# Patient Record
Sex: Female | Born: 2000 | Race: White | Hispanic: Yes | Marital: Single | Smoking: Never smoker
Health system: Southern US, Community
[De-identification: ages and names within clinical notes are randomized; demographics above are authoritative.]

## PROBLEM LIST (undated history)

## (undated) DIAGNOSIS — F419 Anxiety disorder, unspecified: Secondary | ICD-10-CM

## (undated) DIAGNOSIS — L700 Acne vulgaris: Secondary | ICD-10-CM

## (undated) HISTORY — DX: Acne vulgaris: L70.0

## (undated) HISTORY — DX: Anxiety disorder, unspecified: F41.9

---
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
## 2020-03-15 ENCOUNTER — Ambulatory Visit: Payer: BC Managed Care – PPO | Admitting: Dermatology

## 2020-03-15 ENCOUNTER — Other Ambulatory Visit: Payer: Self-pay

## 2020-03-15 VITALS — BP 117/81 | HR 74

## 2020-03-15 DIAGNOSIS — R21 Rash and other nonspecific skin eruption: Secondary | ICD-10-CM | POA: Diagnosis not present

## 2020-03-15 DIAGNOSIS — L700 Acne vulgaris: Secondary | ICD-10-CM

## 2020-03-15 DIAGNOSIS — Z79899 Other long term (current) drug therapy: Secondary | ICD-10-CM

## 2020-03-15 MED ORDER — KETOCONAZOLE 2 % EX CREA: TOPICAL_CREAM | CUTANEOUS | 1 refills | Status: DC

## 2020-03-15 MED ORDER — ADAPALENE 0.3 % EX GEL: 1.0000 "application " | Freq: Every day | CUTANEOUS | 3 refills | Status: DC

## 2020-03-15 MED ORDER — DOXYCYCLINE MONOHYDRATE 100 MG PO CAPS: 100.0000 mg | ORAL_CAPSULE | Freq: Every day | ORAL | 2 refills | Status: DC

## 2020-03-15 MED ORDER — SPIRONOLACTONE 100 MG PO TABS: 100.0000 mg | ORAL_TABLET | Freq: Every day | ORAL | 2 refills | Status: DC

## 2020-03-15 NOTE — Progress Notes (Signed)
   Follow-Up Visit   Subjective  Janet Long is a 19 y.o. female who presents for the following: Acne (face, chest back. Improving. Doxycycline mono 100mg  qd, adapalene 0.3% gel qhs, CeraVe Hydrating Cleanser.) and Rash (left lower neck x 3 weeks. Not itchy. Not currently using anything on it. No perfume or lotions used.).  Not scaly.  Has been sweating more.  She states acne worse with period.   The following portions of the chart were reviewed this encounter and updated as appropriate:     Review of Systems: No other skin or systemic complaints.  Objective  Well appearing patient in no apparent distress; mood and affect are within normal limits.  A focused examination was performed including face, chest, back, neck. Relevant physical exam findings are noted in the Assessment and Plan.  Objective  Face, chest, back: Violaceous macules on cheeks, cystic papules on right and left cheek, resolving inflammatory papules on back and shoulders, violaceous macules on back.  Objective  bilateral lower neck: Light pink brown patches, no scale.  Assessment & Plan  Acne vulgaris Face, chest, back  Start Spironolactone 100mg  take 1 po qd. Spironolactone can cause increased urination and cause blood pressure to decrease. Please watch for signs of lightheadedness and be cautious when changing position. It can sometimes cause breast tenderness or an irregular period in premenopausal women. It can also increase potassium. The increase in potassium usually is not a concern unless you are taking other medicines that also increase potassium, so please be sure your doctor knows all of the other medications you are taking. This medication should not be taken  by pregnant women.   Continue doxycycline monohydrate 100mg  take 1 po qd. Doxycycline should be taken with food to prevent nausea. Do not lay down for 30 minutes after taking. Be cautious with sun exposure and use good sun protection while on  this medication. Pregnant women should not take this medication.    Continue adapalene 0.3% gel qhs. Topical retinoid medications like tretinoin/Retin-A, adapalene/Differin, tazarotene/Fabior, and Epiduo/Epiduo Forte can cause dryness and irritation when first started. Only apply a pea-sized amount to the entire affected area. Avoid applying it around the eyes, edges of mouth and creases at the nose. If you experience irritation, use a good moisturizer first and/or apply the medicine less often. If you are doing well with the medicine, you can increase how often you use it until you are applying every night. Be careful with sun protection while using this medication as it can make you sensitive to the sun. This medicine should not be used by pregnant women.    Continue CeraVe Hydrating Cleanser.  On follow-up visit, anticipate d/c doxycycline if controlled.  spironolactone (ALDACTONE) 100 MG tablet - Face, chest, back  Adapalene (DIFFERIN) 0.3 % gel - Face, chest, back  Rash bilateral lower neck  Intertrigo  Start ketoconazole 2% cream Apply to Aas rash on neck qd/bid until improved. Hydrocortisone 1% Cream prn itch.  ketoconazole (NIZORAL) 2 % cream - bilateral lower neck  Return in about 10 weeks (around 05/24/2020) for acne.   I , CMA, am acting as scribe for , MD .

## 2020-05-31 ENCOUNTER — Ambulatory Visit: Payer: BC Managed Care – PPO | Admitting: Dermatology

## 2020-05-31 ENCOUNTER — Other Ambulatory Visit: Payer: Self-pay

## 2020-05-31 VITALS — BP 121/72 | HR 70

## 2020-05-31 DIAGNOSIS — L700 Acne vulgaris: Secondary | ICD-10-CM

## 2020-05-31 MED ORDER — SPIRONOLACTONE 100 MG PO TABS
100.0000 mg | ORAL_TABLET | Freq: Every day | ORAL | 2 refills | Status: DC
Start: 2020-05-31 — End: 2020-11-23

## 2020-05-31 NOTE — Patient Instructions (Signed)
Spironolactone can cause increased urination and cause blood pressure to decrease. Please watch for signs of lightheadedness and be cautious when changing position. It can sometimes cause breast tenderness or an irregular period in premenopausal women. It can also increase potassium. The increase in potassium usually is not a concern unless you are taking other medicines that also increase potassium, so please be sure your doctor knows all of the other medications you are taking. This medication should not be taken  by pregnant women.  

## 2020-05-31 NOTE — Progress Notes (Signed)
   Follow-Up Visit   Subjective  Janet Long is a 19 y.o. female who presents for the following: Acne (improved - using Spironolactone 100mg  1/2 tab po QD, doxycycline 100mg  QD, adapalene 0.3% gel QHS).  Not getting cystic bumps like before.  She is very happy with how her skin is now with treatment.  No side effects noted from spironolactone.  Takes 50 mg daily.   The following portions of the chart were reviewed this encounter and updated as appropriate:      Review of Systems:  No other skin or systemic complaints except as noted in HPI or Assessment and Plan.  Objective  Well appearing patient in no apparent distress; mood and affect are within normal limits.  A focused examination was performed including face. Relevant physical exam findings are noted in the Assessment and Plan.  Objective  Face: Violaceous macules on cheeks; few closed comedones on chin, forehead.  BP 121/72   Assessment & Plan  Acne vulgaris Face  Improved. Finish doxycycline then d/c Increase Spironolactone 100mg  1 po QHS. If having side effects, will decrease back to 50mg . Continue adapalene 0.3% gel QHS.  Spironolactone can cause increased urination and cause blood pressure to decrease. Please watch for signs of lightheadedness and be cautious when changing position. It can sometimes cause breast tenderness or an irregular period in premenopausal women. It can also increase potassium. The increase in potassium usually is not a concern unless you are taking other medicines that also increase potassium, so please be sure your doctor knows all of the other medications you are taking. This medication should not be taken  by pregnant women.  Topical retinoid medications like tretinoin/Retin-A, adapalene/Differin, tazarotene/Fabior, and Epiduo/Epiduo Forte can cause dryness and irritation when first started. Only apply a pea-sized amount to the entire affected area. Avoid applying it around the eyes, edges  of mouth and creases at the nose. If you experience irritation, use a good moisturizer first and/or apply the medicine less often. If you are doing well with the medicine, you can increase how often you use it until you are applying every night. Be careful with sun protection while using this medication as it can make you sensitive to the sun. This medicine should not be used by pregnant women.    Reordered Medications spironolactone (ALDACTONE) 100 MG tablet  Other Related Medications Adapalene (DIFFERIN) 0.3 % gel  Return in about 3 months (around 08/31/2020) for acne.   I , CMA, am acting as scribe for , MD .  Documentation: I have reviewed the above documentation for accuracy and completeness, and I agree with the above.  MD

## 2020-07-15 ENCOUNTER — Telehealth: Payer: Self-pay

## 2020-07-15 NOTE — Telephone Encounter (Signed)
Patient called regarding breakouts since stopping Doxycycline. She states she has very few Ryzen Deady heads or black heads pop up on her face or back weekly. Should she start back on any medication or just continue Spironolactone?

## 2020-07-19 NOTE — Telephone Encounter (Signed)
She should have increased her Spironolactone to 100 mg daily.  Have her continue that for now.  We can move her follow-up appointment to a month earlier and I can see how the break-outs are doing off the doxycycline.  We may also want to consider other options, like accutane, if the spironolactone isn't helping by itself.  Also continue the Rx adapalene gel qd.

## 2020-07-20 NOTE — Telephone Encounter (Signed)
Left message for the patient to return my call.

## 2020-07-29 NOTE — Telephone Encounter (Signed)
Patient called this afternoon to let me know her acne has clears and she will keep her October appointment.

## 2020-09-06 ENCOUNTER — Other Ambulatory Visit: Payer: Self-pay

## 2020-09-06 ENCOUNTER — Ambulatory Visit (INDEPENDENT_AMBULATORY_CARE_PROVIDER_SITE_OTHER): Payer: BC Managed Care – PPO | Admitting: Dermatology

## 2020-09-06 VITALS — BP 117/71

## 2020-09-06 DIAGNOSIS — L700 Acne vulgaris: Secondary | ICD-10-CM | POA: Diagnosis not present

## 2020-09-06 MED ORDER — DROSPIRENONE-ETHINYL ESTRADIOL 3-0.02 MG PO TABS: 1.0000 | ORAL_TABLET | Freq: Every day | ORAL | 5 refills | Status: DC

## 2020-09-06 NOTE — Patient Instructions (Signed)
Spironolactone can cause increased urination and cause blood pressure to decrease. Please watch for signs of lightheadedness and be cautious when changing position. It can sometimes cause breast tenderness or an irregular period in premenopausal women. It can also increase potassium. The increase in potassium usually is not a concern unless you are taking other medicines that also increase potassium, so please be sure your doctor knows all of the other medications you are taking. This medication should not be taken  by pregnant women.  Topical retinoid medications like tretinoin/Retin-A, adapalene/Differin, tazarotene/Fabior, and Epiduo/Epiduo Forte can cause dryness and irritation when first started. Only apply a pea-sized amount to the entire affected area. Avoid applying it around the eyes, edges of mouth and creases at the nose. If you experience irritation, use a good moisturizer first and/or apply the medicine less often. If you are doing well with the medicine, you can increase how often you use it until you are applying every night. Be careful with sun protection while using this medication as it can make you sensitive to the sun. This medicine should not be used by pregnant women.   

## 2020-09-06 NOTE — Progress Notes (Signed)
   Follow-Up Visit   Subjective  Janet Long is a 19 y.o. female who presents for the following: Acne (face, chest, back. Much improved. She is taking Spironolactone 100mg  daily and using Adapalene 0.3% Gel. She is not having many breakouts. She has been off of doxycycline for 3 months.). She has had irregular menstrual cycles since starting Spironolactone. She currently is on her period.  No other side effects.  No dizziness, light-headedness. No family history of breast cancer or clotting disorders.   The following portions of the chart were reviewed this encounter and updated as appropriate:      Review of Systems:  No other skin or systemic complaints except as noted in HPI or Assessment and Plan.  Objective  Well appearing patient in no apparent distress; mood and affect are within normal limits.  A focused examination was performed including face, chest. Relevant physical exam findings are noted in the Assessment and Plan.  Objective  Face: Few comedones perioral, otherwise face clear. Old  acne scarring on BL cheeks BP 117/71    Assessment & Plan  Acne vulgaris Face  Improved on spironolactone, but irregular periods  Continue Adapalene 0.3% Gel qhs face. Continue Spironolactone 100mg  1 po QD. Start Yaz take as directed 5Rf.  Discussed coming off Spironolactone and continuing with Yaz. May consider on follow-up.   Topical retinoid medications like tretinoin/Retin-A, adapalene/Differin, tazarotene/Fabior, and Epiduo/Epiduo Forte can cause dryness and irritation when first started. Only apply a pea-sized amount to the entire affected area. Avoid applying it around the eyes, edges of mouth and creases at the nose. If you experience irritation, use a good moisturizer first and/or apply the medicine less often. If you are doing well with the medicine, you can increase how often you use it until you are applying every night. Be careful with sun protection while using this  medication as it can make you sensitive to the sun. This medicine should not be used by pregnant women.   Spironolactone can cause increased urination and cause blood pressure to decrease. Please watch for signs of lightheadedness and be cautious when changing position. It can sometimes cause breast tenderness or an irregular period in premenopausal women. It can also increase potassium. The increase in potassium usually is not a concern unless you are taking other medicines that also increase potassium, so please be sure your doctor knows all of the other medications you are taking. This medication should not be taken  by pregnant women.   drospirenone-ethinyl estradiol (YAZ) 3-0.02 MG tablet - Face  Other Related Medications Adapalene (DIFFERIN) 0.3 % gel spironolactone (ALDACTONE) 100 MG tablet  Return in about 3 months (around 12/07/2020) for acne.  [id number], CMA, am acting as scribe for 02/04/2021, MD .  Documentation: I have reviewed the above documentation for accuracy and completeness, and I agree with the above.  Cherlyn Labella MD

## 2020-09-29 ENCOUNTER — Encounter: Payer: Self-pay | Admitting: Family Medicine

## 2020-09-29 ENCOUNTER — Ambulatory Visit: Payer: BC Managed Care – PPO | Admitting: Family Medicine

## 2020-09-29 ENCOUNTER — Ambulatory Visit: Payer: Self-pay | Admitting: Family Medicine

## 2020-09-29 ENCOUNTER — Other Ambulatory Visit: Payer: Self-pay

## 2020-09-29 VITALS — BP 120/79 | HR 70 | Temp 98.1°F | Ht 62.0 in | Wt 140.0 lb

## 2020-09-29 DIAGNOSIS — M25512 Pain in left shoulder: Secondary | ICD-10-CM | POA: Diagnosis not present

## 2020-09-29 DIAGNOSIS — R197 Diarrhea, unspecified: Secondary | ICD-10-CM

## 2020-09-29 DIAGNOSIS — Z23 Encounter for immunization: Secondary | ICD-10-CM | POA: Diagnosis not present

## 2020-09-29 DIAGNOSIS — L700 Acne vulgaris: Secondary | ICD-10-CM | POA: Diagnosis not present

## 2020-09-29 DIAGNOSIS — R109 Unspecified abdominal pain: Secondary | ICD-10-CM | POA: Diagnosis not present

## 2020-09-29 DIAGNOSIS — F411 Generalized anxiety disorder: Secondary | ICD-10-CM

## 2020-09-29 MED ORDER — HYDROXYZINE HCL 25 MG PO TABS: 12.5000 mg | ORAL_TABLET | Freq: Three times a day (TID) | ORAL | 1 refills | Status: AC | PRN

## 2020-09-29 NOTE — Progress Notes (Addendum)
10/27/202111:45 AM  Janet Long 2001/10/16, 19 y.o., female [id number]  Chief Complaint  Patient presents with  . low abd pain x 3 days    diarrhea in am , abd pain intermittent  during the day, L shoulder pain      HPI:   Patient is a 19 y.o. female with past medical history significant for acne who presents today for diarrhea, abdominal pain and left shoulder pain.   Abdominal pain x3 days, prior to this was every 6 months Intermittent Sharp pain, lasts a few seconds Doesn't happen during the night, doesn't affect sleep Not associated with food Seems to be getting more frequent Started on yaz a few weeks ago LMP: October 1st Normally 21 day cycle Anxiety Common to have Morning with abdominal pain and diarrhea, this has been occurring for about 4 years No changes in diet Denies bloating, or pain on palpation Lower abdominal pain Radiates at times to Shoulder pain and to right lower ribs. Denies spotting Denies urinary symptoms,   Anxiety She has had long term issues with anxiety She has never been treated for this She is still able to accomplish daily activities Throughout the day gets worse with situations    Depression screen [facility] 2/9 09/29/2020 05/03/2017  Decreased Interest 0 0  Down, Depressed, Hopeless 0 0  PHQ - 2 Score 0 0  Altered sleeping 1 0  Tired, decreased energy 0 0  Change in appetite 0 1  Feeling bad or failure about yourself  1 1  Trouble concentrating 1 0  Moving slowly or fidgety/restless 0 0  Suicidal thoughts 0 0  PHQ-9 Score 3 2  Difficult doing work/chores Somewhat difficult -    Fall Risk  09/29/2020  Falls in the past year? 0  Number falls in past yr: 0  Injury with Fall? 0  Follow up Falls evaluation completed     No Known Allergies  Prior to Admission medications   Medication Sig Start Date End Date Taking? Authorizing Provider  Adapalene (DIFFERIN) 0.3 % gel Apply 1 application topically at bedtime. 03/15/20    Willeen Niece, MD  drospirenone-ethinyl estradiol (YAZ) 3-0.02 MG tablet Take 1 tablet by mouth daily. 09/06/20   Willeen Niece, MD  spironolactone (ALDACTONE) 100 MG tablet Take 1 tablet (100 mg total) by mouth daily. 05/31/20   Willeen Niece, MD    Past Medical History:  Diagnosis Date  . Acne vulgaris   . Anxiety     History reviewed. No pertinent surgical history.  Social History   Tobacco Use  . Smoking status: Never Smoker  . Smokeless tobacco: Never Used  Substance Use Topics  . Alcohol use: No    Family History  Problem Relation Age of Onset  . Cancer Paternal Grandmother        breast    Review of Systems  Constitutional: Negative for chills, fever and malaise/fatigue.  Eyes: Negative for blurred vision and double vision.  Respiratory: Negative for cough, shortness of breath and wheezing.   Cardiovascular: Negative for chest pain, palpitations and leg swelling.  Gastrointestinal: Negative for blood in stool, constipation, heartburn, nausea and vomiting.       Daily morning loose stools, intermittent abdominal pain  Genitourinary: Negative for dysuria, flank pain, frequency, hematuria and urgency.  Musculoskeletal: Negative for back pain and joint pain.  Skin: Negative for rash.  Neurological: Negative for dizziness, weakness and headaches.     OBJECTIVE:  Today's Vitals   09/29/20 1103  BP: 120/79  Pulse: 70  Temp: 98.1 F (36.7 C)  SpO2: 98%  Weight: 140 lb (63.5 kg)  Height: 5\' 2"  (1.575 m)   Body mass index is 25.61 kg/m.   Physical Exam Constitutional:      General: She is not in acute distress.    Appearance: Normal appearance. She is not ill-appearing.  HENT:     Head: Normocephalic.  Cardiovascular:     Rate and Rhythm: Normal rate and regular rhythm.     Pulses: Normal pulses.     Heart sounds: Normal heart sounds. No murmur heard.  No friction rub. No gallop.   Pulmonary:     Effort: Pulmonary effort is normal. No respiratory  distress.     Breath sounds: Normal breath sounds. No stridor. No wheezing, rhonchi or rales.  Abdominal:     General: Abdomen is flat. Bowel sounds are normal. There is no distension.     Palpations: Abdomen is soft.     Tenderness: There is no abdominal tenderness. There is no right CVA tenderness or left CVA tenderness.  Musculoskeletal:     Right lower leg: No edema.     Left lower leg: No edema.  Skin:    General: Skin is warm and dry.  Neurological:     Mental Status: She is alert and oriented to person, place, and time.  Psychiatric:        Mood and Affect: Mood normal.        Behavior: Behavior normal.     No results found for this or any previous visit (from the past 24 hour(s)).  No results found.   ASSESSMENT and PLAN  Problem List Items Addressed This Visit      Musculoskeletal and Integument   Acne vulgaris    Other Visit Diagnoses    Diarrhea, unspecified type    -  Primary   Abdominal pain, mid-lower: Most likely related to uterine cramps given recent start of birth control OTC NSAIDS as needed and heating pad for pain   Encouraged to return if symptoms did not improve Didn't want to give a urine sample at this time.   Acute pain of left shoulder       Generalized anxiety disorder       Relevant Medications   hydrOXYzine (ATARAX/VISTARIL) 25 MG tablet 1/2 tab prn for anxiety, discussed this may make her sleepy Contact For therapy Provided 1. [facility] ([address] [place],Heather Hulan [place]) [phone] 2. [facility] - [phone] [place]) [phone] 3. - [phone] Psychological - [phone] 4. [facility] - [phone] (do not file insurance) 5. [facility] - accepts most major insurances. [phone] or email [email or web address]        Return if symptoms worsen or fail to improve, for Annual physical.    [phone] Kynslie Ringle,  FNP-BC Primary [facility] at [facility] [address] [place], [place] Ph.  [phone] Fax [phone]  I have reviewed and agree with above documentation. [phone], MD

## 2020-09-29 NOTE — Patient Instructions (Addendum)
Within 3 months on birth control pill your cycles should regulate Follow up if no improvement in symptoms. Take hydroxyzine as needed for anxiety, may make you sleepy  For therapy -- 1. [facility] ([address] [place], [place] Joyc[place], [place]) [phone] 2. [facility] Raynelle Fanning([place]) [phone] 3. [facility] - [phone] 4. [facility] - [phone] (do not file insurance) 5. [facility] - accepts most major insurances. [phone] or email [email or web address]  Managing Anxiety, Adult After being diagnosed with an anxiety disorder, you may be relieved to know why you have felt or behaved a certain way. You may also feel overwhelmed about the treatment ahead and what it will mean for your life. With care and support, you can manage this condition and recover from it. How to manage lifestyle changes Managing stress and anxiety  Stress is your body's reaction to life changes and events, both good and bad. Most stress will last just a few hours, but stress can be ongoing and can lead to more than just stress. Although stress can play a major role in anxiety, it is not the same as anxiety. Stress is usually caused by something external, such as a deadline, test, or competition. Stress normally passes after the triggering event has ended.  Anxiety is caused by something internal, such as imagining a terrible outcome or worrying that something will go wrong that will devastate you. Anxiety often does not go away even after the triggering event is over, and it can become long-term (chronic) worry. It is important to understand the differences between stress and anxiety and to manage your stress effectively so that it does not lead to an anxious response. Talk with your health care provider or a counselor to learn more about reducing anxiety and stress. He or she may  suggest tension reduction techniques, such as:  Music therapy. This can include creating or listening to music that you enjoy and that inspires you.  Mindfulness-based meditation. This involves being aware of your normal breaths while not trying to control your breathing. It can be done while sitting or walking.  Centering prayer. This involves focusing on a word, phrase, or sacred image that means something to you and brings you peace.  Deep breathing. To do this, expand your stomach and inhale slowly through your nose. Hold your breath for 3-5 seconds. Then exhale slowly, letting your stomach muscles relax.  Self-talk. This involves identifying thought patterns that lead to anxiety reactions and changing those patterns.  Muscle relaxation. This involves tensing muscles and then relaxing them. Choose a tension reduction technique that suits your lifestyle and personality. These techniques take time and practice. Set aside 5-15 minutes a day to do them. Therapists can offer counseling and training in these techniques. The training to help with anxiety may be covered by some insurance plans. Other things you can do to manage stress and anxiety include:  Keeping a stress/anxiety diary. This can help you learn what triggers your reaction and then learn ways to manage your response.  Thinking about how you react to certain situations. You may not be able to control everything, but you can control your response.  Making time for activities that help you relax and not feeling guilty about spending your time in this way.  Visual imagery and yoga can help you stay calm and relax.  Medicines Medicines can help ease symptoms. Medicines for anxiety include:  Anti-anxiety drugs.  Antidepressants.  Medicines are often used as a primary treatment for anxiety disorder. Medicines will be prescribed by a health care provider. When used together, medicines, psychotherapy, and tension reduction techniques  may be the most effective treatment. Relationships Relationships can play a big part in helping you recover. Try to spend more time connecting with trusted friends and family members. Consider going to couples counseling, taking family education classes, or going to family therapy. Therapy can help you and others better understand your condition. How to recognize changes in your anxiety Everyone responds differently to treatment for anxiety. Recovery from anxiety happens when symptoms decrease and stop interfering with your daily activities at home or work. This may mean that you will start to:  Have better concentration and focus. Worry will interfere less in your daily thinking.  Sleep better.  Be less irritable.  Have more energy.  Have improved memory. It is important to recognize when your condition is getting worse. Contact your health care provider if your symptoms interfere with home or work and you feel like your condition is not improving. Follow these instructions at home: Activity  Exercise. Most adults should do the following: ? Exercise for at least 150 minutes each week. The exercise should increase your heart rate and make you sweat (moderate-intensity exercise). ? Strengthening exercises at least twice a week.  Get the right amount and quality of sleep. Most adults need 7-9 hours of sleep each night. Lifestyle   Eat a healthy diet that includes plenty of vegetables, fruits, whole grains, low-fat dairy products, and lean protein. Do not eat a lot of foods that are high in solid fats, added sugars, or salt.  Make choices that simplify your life.  Do not use any products that contain nicotine or tobacco, such as cigarettes, e-cigarettes, and chewing tobacco. If you need help quitting, ask your health care provider.  Avoid caffeine, alcohol, and certain over-the-counter cold medicines. These may make you feel worse. Ask your pharmacist which medicines to avoid. General  instructions  Take over-the-counter and prescription medicines only as told by your health care provider.  Keep all follow-up visits as told by your health care provider. This is important. Where to find support You can get help and support from these sources:  Self-help groups.  Online and [employer].  A trusted spiritual leader.  Couples counseling.  Family education classes.  Family therapy. Where to find more information You may find that joining a support group helps you deal with your anxiety. The following sources can help you locate counselors or support groups near you:  Mental Health America: www.mentalhealthamerica.net  Anxiety and Depression Association of [place] (ADAA): ProgramCam.de  [employer] on Mental Illness (NAMI): www.nami.org Contact a health care provider if you:  Have a hard time staying focused or finishing daily tasks.  Spend many hours a day feeling worried about everyday life.  Become exhausted by worry.  Start to have headaches, feel tense, or have nausea.  Urinate more than normal.  Have diarrhea. Get help right away if you have:  A racing heart and shortness of breath.  Thoughts of hurting yourself or others. If you ever feel like you may hurt yourself or others, or have thoughts about taking your own life, get help right away. You can go to your nearest emergency department or call:  Your local emergency services (911 in the [place]  A suicide crisis helpline, such as the National Suicide Prevention Lifeline at 9183587038. This is open 24 hours a  day. Summary  Taking steps to learn and use tension reduction techniques can help calm you and help prevent triggering an anxiety reaction.  When used together, medicines, psychotherapy, and tension reduction techniques may be the most effective treatment.  Family, friends, and partners can play a big part in helping you recover from an anxiety disorder. This  information is not intended to replace advice given to you by your health care provider. Make sure you discuss any questions you have with your health care provider. Document Revised: 04/22/2019 Document Reviewed: 04/22/2019 Elsevier Patient Education  2020 [employer].   Hormonal Contraception Information Hormonal contraception is a type of birth control that uses hormones to prevent pregnancy. It usually involves a combination of the hormones estrogen and progesterone or only the hormone progesterone. Hormonal contraception works in these ways:  It thickens the mucus in the cervix, making it harder for sperm to enter the uterus.  It changes the lining of the uterus, making it harder for an egg to implant.  It may stop the ovaries from releasing eggs (ovulation). Some women who take hormonal contraceptives that contain only progesterone may continue to ovulate. Hormonal contraception cannot prevent sexually transmitted infections (STIs). Pregnancy may still occur. Estrogen and progesterone contraceptives Contraceptives that use a combination of estrogen and progesterone are available in these forms:  Pill. Pills come in different combinations of hormones. They must be taken at the same time each day. Pills can affect your period, causing you to get your period once every three months or not at all.  Patch. The patch must be worn on the lower abdomen for three weeks and then removed on the fourth.  Vaginal ring. The ring is placed in the vagina and left there for three weeks. It is then removed for one week. Progesterone contraceptives Contraceptives that use progesterone only are available in these forms:  Pill. Pills should be taken every day of the cycle.  Intrauterine device (IUD). This device is inserted into the uterus and removed or replaced every five years or sooner.  Implant. Plastic rods are placed under the skin of the upper arm. They are removed or replaced every three  years or sooner.  Injection. The injection is given once every 90 days. What are the side effects? The side effects of estrogen and progesterone contraceptives include:  Nausea.  Headaches.  Breast tenderness.  Bleeding or spotting between menstrual cycles.  High blood pressure (rare).  Strokes, heart attacks, or blood clots (rare) Side effects of progesterone-only contraceptives include:  Nausea.  Headaches.  Breast tenderness.  Unpredictable menstrual bleeding.  High blood pressure (rare). Talk to your health care provider about what side effects may affect you. Where to find more information  Ask your health care provider for more information and resources about hormonal contraception.  U.S. Department of Health and Cytogeneticist on Women's Health: http://hoffman.com/ Questions to ask:  What type of hormonal contraception is right for me?  How long should I plan to use hormonal contraception?  What are the side effects of the hormonal contraception method I choose?  How can I prevent STIs while using hormonal contraception? Contact a health care provider if:  You start taking hormonal contraceptives and you develop persistent or severe side effects. Summary  Estrogen and progesterone are hormones used in many forms of birth control.  Talk to your health care provider about what side effects may affect you.  Hormonal contraception cannot prevent sexually transmitted infections (STIs).  Ask your  health care provider for more information and resources about hormonal contraception. This information is not intended to replace advice given to you by your health care provider. Make sure you discuss any questions you have with your health care provider. Document Revised: 03/17/2019 Document Reviewed: 10/20/2016 Elsevier Patient Education  [employer].    If you have lab work done today you will be contacted with your lab results within the next  2 weeks.  If you have not heard from [place] then please contact us. The fastest way to get your results is to register for My Chart.   IF you received an x-ray today, you will receive an invoice from [facility]. Please contact [facility] at [phone] with questions or concerns regarding your invoice.   IF you received labwork today, you will receive an invoice from [facility]. Please contact [facility] at [phone] with questions or concerns regarding your invoice.   Our billing staff will not be able to assist you with questions regarding bills from these companies.  You will be contacted with the lab results as soon as they are available. The fastest way to get your results is to activate your My Chart account. Instructions are located on the last page of this paperwork. If you have not heard from [place] regarding the results in 2 weeks, please contact this office.

## 2020-11-22 ENCOUNTER — Other Ambulatory Visit: Payer: Self-pay | Admitting: Dermatology

## 2020-11-22 DIAGNOSIS — L700 Acne vulgaris: Secondary | ICD-10-CM

## 2020-12-06 ENCOUNTER — Ambulatory Visit: Payer: BC Managed Care – PPO | Admitting: Family Medicine

## 2020-12-07 ENCOUNTER — Other Ambulatory Visit: Payer: Self-pay

## 2020-12-07 ENCOUNTER — Ambulatory Visit: Payer: BC Managed Care – PPO | Admitting: Dermatology

## 2020-12-07 DIAGNOSIS — L700 Acne vulgaris: Secondary | ICD-10-CM

## 2020-12-07 NOTE — Progress Notes (Signed)
   Follow-Up Visit   Subjective  Janet Long is a 20 y.o. female who presents for the following: Acne (3 month follow up on acne. Patient still taking Adapalene 0.3 % gel, spironolactone 100 mg tab and Yaz. Patient reports a new breakout on chin and she has concerns with blackheads on nose. ).   The following portions of the chart were reviewed this encounter and updated as appropriate:        Objective  Well appearing patient in no apparent distress; mood and affect are within normal limits.  A focused examination was performed including face . Relevant physical exam findings are noted in the Assessment and Plan.  Objective  Head - Anterior (Face): Resolving cyst on right chin Few closed comedones on chin and nose  Today BP on left wrist 111/73   Assessment & Plan  Acne vulgaris Head - Anterior (Face)  Cystic, controlled Continue Adapalene 0.3% gel, Yaz, and Spironolactone 100 mg  tablet.   Recommend neutrogena BPO spot treatment as needed for new breakouts. Also OTC Sal Acid cleanser for blackheads  Topical retinoid medications like tretinoin/Retin-A, adapalene/Differin, tazarotene/Fabior, and Epiduo/Epiduo Forte can cause dryness and irritation when first started. Only apply a pea-sized amount to the entire affected area. Avoid applying it around the eyes, edges of mouth and creases at the nose. If you experience irritation, use a good moisturizer first and/or apply the medicine less often. If you are doing well with the medicine, you can increase how often you use it until you are applying every night. Be careful with sun protection while using this medication as it can make you sensitive to the sun. This medicine should not be used by pregnant women.   Spironolactone can cause increased urination and cause blood pressure to decrease. Please watch for signs of lightheadedness and be cautious when changing position. It can sometimes cause breast tenderness or an irregular  period in premenopausal women. It can also increase potassium. The increase in potassium usually is not a concern unless you are taking other medicines that also increase potassium, so please be sure your doctor knows all of the other medications you are taking. This medication should not be taken by pregnant women.  This medicine should also not be taken together with sulfa drugs like Bactrim (trimethoprim/sulfamethexazole).    Other Related Medications Adapalene (DIFFERIN) 0.3 % gel drospirenone-ethinyl estradiol (YAZ) 3-0.02 MG tablet spironolactone (ALDACTONE) 100 MG tablet  Return in about 6 months (around 06/06/2021) for acne follow up.   I, Asher Muir, CMA, am acting as scribe for Willeen Niece, MD.  Documentation: I have reviewed the above documentation for accuracy and completeness, and I agree with the above.  Willeen Niece MD

## 2020-12-07 NOTE — Patient Instructions (Addendum)
Recommend Neutrogena salicylic acid wash and spot treatment as needed.   Topical retinoid medications like tretinoin/Retin-A, adapalene/Differin, tazarotene/Fabior, and Epiduo/Epiduo Forte can cause dryness and irritation when first started. Only apply a pea-sized amount to the entire affected area. Avoid applying it around the eyes, edges of mouth and creases at the nose. If you experience irritation, use a good moisturizer first and/or apply the medicine less often. If you are doing well with the medicine, you can increase how often you use it until you are applying every night. Be careful with sun protection while using this medication as it can make you sensitive to the sun. This medicine should not be used by pregnant women.   Spironolactone can cause increased urination and cause blood pressure to decrease. Please watch for signs of lightheadedness and be cautious when changing position. It can sometimes cause breast tenderness or an irregular period in premenopausal women. It can also increase potassium. The increase in potassium usually is not a concern unless you are taking other medicines that also increase potassium, so please be sure your doctor knows all of the other medications you are taking. This medication should not be taken by pregnant women.  This medicine should also not be taken together with sulfa drugs like Bactrim (trimethoprim/sulfamethexazole).

## 2021-02-22 ENCOUNTER — Other Ambulatory Visit: Payer: Self-pay | Admitting: Dermatology

## 2021-02-22 DIAGNOSIS — L700 Acne vulgaris: Secondary | ICD-10-CM

## 2021-03-10 ENCOUNTER — Ambulatory Visit: Payer: BC Managed Care – PPO | Admitting: Family Medicine

## 2021-03-14 ENCOUNTER — Ambulatory Visit: Payer: BC Managed Care – PPO | Admitting: Family Medicine

## 2021-03-15 ENCOUNTER — Other Ambulatory Visit: Payer: Self-pay

## 2021-03-15 DIAGNOSIS — L700 Acne vulgaris: Secondary | ICD-10-CM

## 2021-03-15 MED ORDER — SPIRONOLACTONE 100 MG PO TABS: 100.0000 mg | ORAL_TABLET | Freq: Every day | ORAL | 2 refills | Status: DC

## 2021-03-15 NOTE — Progress Notes (Signed)
Spironolactone RF Request approved

## 2021-06-07 ENCOUNTER — Ambulatory Visit: Payer: BC Managed Care – PPO | Admitting: Dermatology

## 2021-07-20 ENCOUNTER — Other Ambulatory Visit: Payer: Self-pay

## 2021-07-20 ENCOUNTER — Ambulatory Visit: Payer: Self-pay | Admitting: Dermatology

## 2021-07-20 VITALS — BP 117/69

## 2021-07-20 DIAGNOSIS — L700 Acne vulgaris: Secondary | ICD-10-CM

## 2021-07-20 DIAGNOSIS — L810 Postinflammatory hyperpigmentation: Secondary | ICD-10-CM

## 2021-07-20 MED ORDER — PIMECROLIMUS 1 % EX CREA: TOPICAL_CREAM | CUTANEOUS | 2 refills | Status: AC

## 2021-07-20 MED ORDER — DROSPIRENONE-ETHINYL ESTRADIOL 3-0.02 MG PO TABS
1.0000 | ORAL_TABLET | Freq: Every day | ORAL | 5 refills | Status: DC
Start: 2021-07-20 — End: 2022-01-16

## 2021-07-20 MED ORDER — ADAPALENE 0.3 % EX GEL: CUTANEOUS | 3 refills | Status: DC

## 2021-07-20 MED ORDER — SPIRONOLACTONE 100 MG PO TABS: 100.0000 mg | ORAL_TABLET | Freq: Every day | ORAL | 5 refills | Status: AC

## 2021-07-20 NOTE — Patient Instructions (Signed)
Spironolactone can cause increased urination and cause blood pressure to decrease. Please watch for signs of lightheadedness and be cautious when changing position. It can sometimes cause breast tenderness or an irregular period in premenopausal women. It can also increase potassium. The increase in potassium usually is not a concern unless you are taking other medicines that also increase potassium, so please be sure your doctor knows all of the other medications you are taking. This medication should not be taken by pregnant women.  This medicine should also not be taken together with sulfa drugs like Bactrim (trimethoprim/sulfamethexazole).   Topical retinoid medications like tretinoin/Retin-A, adapalene/Differin, tazarotene/Fabior, and Epiduo/Epiduo Forte can cause dryness and irritation when first started. Only apply a pea-sized amount to the entire affected area. Avoid applying it around the eyes, edges of mouth and creases at the nose. If you experience irritation, use a good moisturizer first and/or apply the medicine less often. If you are doing well with the medicine, you can increase how often you use it until you are applying every night. Be careful with sun protection while using this medication as it can make you sensitive to the sun. This medicine should not be used by pregnant women.   If you have any questions or concerns for your doctor, please call our main line at [phone] and press option 4 to reach your doctor's medical assistant. If no one answers, please leave a voicemail as directed and we will return your call as soon as possible. Messages left after 4 pm will be answered the following business day.   You may also send us a message via MyChart. We typically respond to MyChart messages within 1-2 business days.  For prescription refills, please ask your pharmacy to contact our office. Our fax number is [phone].  If you have an urgent issue when the clinic is closed that  cannot wait until the next business day, you can page your doctor at the number below.    Please note that while we do our best to be available for urgent issues outside of office hours, we are not available 24/7.   If you have an urgent issue and are unable to reach us, you may choose to seek medical care at your doctor's office, retail clinic, urgent care center, or emergency room.  If you have a medical emergency, please immediately call 911 or go to the emergency department.  Pager Numbers  - Dr. Kowalski: [phone]  - Dr. Moye: [phone]  - Dr. Stewart: [phone]  In the event of inclement weather, please call our main line at [phone] for an update on the status of any delays or closures.  Dermatology Medication Tips: Please keep the boxes that topical medications come in in order to help keep track of the instructions about where and how to use these. Pharmacies typically print the medication instructions only on the boxes and not directly on the medication tubes.   If your medication is too expensive, please contact our office at [phone] option 4 or send us a message through MyChart.   We are unable to tell what your co-pay for medications will be in advance as this is different depending on your insurance coverage. However, we may be able to find a substitute medication at lower cost or fill out paperwork to get insurance to cover a needed medication.   If a prior authorization is required to get your medication covered by your insurance company, please allow us 1-2 business days to complete this   process.  Drug prices often vary depending on where the prescription is filled and some pharmacies may offer cheaper prices.  The website www.goodrx.com contains coupons for medications through different pharmacies. The prices here do not account for what the cost may be with help from insurance (it may be cheaper with your insurance), but the website can give you the  price if you did not use any insurance.  - You can print the associated coupon and take it with your prescription to the pharmacy.  - You may also stop by our office during regular business hours and pick up a GoodRx coupon card.  - If you need your prescription sent electronically to a different pharmacy, notify our office through [facility] MyChart or by phone at [phone] option 4.  

## 2021-07-20 NOTE — Progress Notes (Signed)
   Follow-Up Visit   Subjective  Janet Long is a 19 y.o. female who presents for the following: Acne (Face, chest, back. Controlled on Spironolactone 100mg  daily and adapalene 0.3% gel qohs. Tried Salicylic acid cleanser, but burned.) and Skin Discoloration (Bilateral axilla x 6 years. She has tried at-home remedies with lemons and potatoes, which has helped clear a little. No itch or irritation.).   The following portions of the chart were reviewed this encounter and updated as appropriate:       Review of Systems:  No other skin or systemic complaints except as noted in HPI or Assessment and Plan.  Objective  Well appearing patient in no apparent distress; mood and affect are within normal limits.  A focused examination was performed including face. Relevant physical exam findings are noted in the Assessment and Plan.  face Few open and closed comedones on chin, nose, forehead.  BP 117/69  bilateral axilla Mild velvety hyperpigmentation. No erythema   Assessment & Plan  Acne vulgaris face  Improved- cystic component is well-controlled  Continue adapalene 0.3% gel qhs as tolerated. Continue Spironolactone 100mg  1 po QD Continue [name] OCP as directed.  Spironolactone can cause increased urination and cause blood pressure to decrease. Please watch for signs of lightheadedness and be cautious when changing position. It can sometimes cause breast tenderness or an irregular period in premenopausal women. It can also increase potassium. The increase in potassium usually is not a concern unless you are taking other medicines that also increase potassium, so please be sure your doctor knows all of the other medications you are taking. This medication should not be taken by pregnant women.  This medicine should also not be taken together with sulfa drugs like Bactrim (trimethoprim/sulfamethexazole).   Topical retinoid medications like tretinoin/Retin-A, adapalene/Differin,  tazarotene/Fabior, and Epiduo/Epiduo Forte can cause dryness and irritation when first started. Only apply a pea-sized amount to the entire affected area. Avoid applying it around the eyes, edges of mouth and creases at the nose. If you experience irritation, use a good moisturizer first and/or apply the medicine less often. If you are doing well with the medicine, you can increase how often you use it until you are applying every night. Be careful with sun protection while using this medication as it can make you sensitive to the sun. This medicine should not be used by pregnant women.      Related Medications drospirenone-ethinyl estradiol ([name]) 3-0.02 MG tablet Take 1 tablet by mouth daily.  spironolactone (ALDACTONE) 100 MG tablet Take 1 tablet (100 mg total) by mouth daily.  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 Recommend switching deodorant to sensitive, no scent.  Benign, observe.    Start Elidel Cream Apply to AA under arms qd/bid until improved dsp 30g 2Rf.  pimecrolimus (ELIDEL) 1 % cream - bilateral axilla Apply to rash under arms 1-2 times daily until improved.  Return in about 6 months (around 01/20/2022) for acne.  1998, CMA, am acting as scribe for 01/22/2022, MD . Documentation: I have reviewed the above documentation for accuracy and completeness, and I agree with the above.  Cherlyn Labella MD

## 2022-01-13 DIAGNOSIS — Z3041 Encounter for surveillance of contraceptive pills: Secondary | ICD-10-CM | POA: Diagnosis not present

## 2022-01-16 ENCOUNTER — Other Ambulatory Visit: Payer: Self-pay | Admitting: Dermatology

## 2022-01-16 DIAGNOSIS — L700 Acne vulgaris: Secondary | ICD-10-CM

## 2022-01-26 DIAGNOSIS — Z2839 Other underimmunization status: Secondary | ICD-10-CM | POA: Diagnosis not present

## 2022-01-26 DIAGNOSIS — Z124 Encounter for screening for malignant neoplasm of cervix: Secondary | ICD-10-CM | POA: Diagnosis not present

## 2022-01-26 DIAGNOSIS — Z0000 Encounter for general adult medical examination without abnormal findings: Secondary | ICD-10-CM | POA: Diagnosis not present

## 2022-01-26 DIAGNOSIS — Z23 Encounter for immunization: Secondary | ICD-10-CM | POA: Diagnosis not present

## 2022-03-13 DIAGNOSIS — N760 Acute vaginitis: Secondary | ICD-10-CM | POA: Diagnosis not present

## 2022-03-28 DIAGNOSIS — B379 Candidiasis, unspecified: Secondary | ICD-10-CM | POA: Diagnosis not present

## 2022-03-28 DIAGNOSIS — R238 Other skin changes: Secondary | ICD-10-CM | POA: Diagnosis not present

## 2022-04-20 DIAGNOSIS — M26609 Unspecified temporomandibular joint disorder, unspecified side: Secondary | ICD-10-CM | POA: Diagnosis not present

## 2022-04-20 DIAGNOSIS — B349 Viral infection, unspecified: Secondary | ICD-10-CM | POA: Diagnosis not present

## 2022-05-10 DIAGNOSIS — B379 Candidiasis, unspecified: Secondary | ICD-10-CM | POA: Diagnosis not present

## 2022-05-17 DIAGNOSIS — B3731 Acute candidiasis of vulva and vagina: Secondary | ICD-10-CM | POA: Diagnosis not present

## 2022-05-17 DIAGNOSIS — A749 Chlamydial infection, unspecified: Secondary | ICD-10-CM | POA: Diagnosis not present

## 2022-05-17 DIAGNOSIS — N899 Noninflammatory disorder of vagina, unspecified: Secondary | ICD-10-CM | POA: Diagnosis not present

## 2022-05-24 ENCOUNTER — Encounter: Payer: Self-pay | Admitting: Dermatology

## 2022-05-24 ENCOUNTER — Ambulatory Visit: Payer: BC Managed Care – PPO | Admitting: Dermatology

## 2022-05-24 DIAGNOSIS — L700 Acne vulgaris: Secondary | ICD-10-CM

## 2022-05-24 DIAGNOSIS — L921 Necrobiosis lipoidica, not elsewhere classified: Secondary | ICD-10-CM

## 2022-05-24 MED ORDER — CLOBETASOL PROPIONATE 0.05 % EX CREA: TOPICAL_CREAM | CUTANEOUS | 2 refills | Status: DC

## 2022-05-24 MED ORDER — TACROLIMUS 0.1 % EX OINT: TOPICAL_OINTMENT | CUTANEOUS | 2 refills | Status: DC

## 2022-05-24 MED ORDER — DAPSONE 7.5 % EX GEL: CUTANEOUS | 2 refills | Status: AC

## 2022-05-24 NOTE — Patient Instructions (Addendum)
Continue Adapalene 0.3% gel to face at bedtime, wash off in morning.   Start Aczone 7.5% gel to face every morning, wash off at bedtime.    Topical retinoid medications like tretinoin/Retin-A, adapalene/Differin, tazarotene/Fabior, and Epiduo/Epiduo Forte can cause dryness and irritation when first started. Only apply a pea-sized amount to the entire affected area. Avoid applying it around the eyes, edges of mouth and creases at the nose. If you experience irritation, use a good moisturizer first and/or apply the medicine less often. If you are doing well with the medicine, you can increase how often you use it until you are applying every night. Be careful with sun protection while using this medication as it can make you sensitive to the sun. This medicine should not be used by pregnant women.     Necrobiosis Lipoidica:  Start Tacrolimus 0.1% ointment twice daily.  Start Clobetasol 0.05% cream twice daily. Avoid applying to face, groin, and axilla. Use as directed. Long-term use can cause thinning of the skin.   Topical steroids (such as triamcinolone, fluocinolone, fluocinonide, mometasone, clobetasol, halobetasol, betamethasone, hydrocortisone) can cause thinning and lightening of the skin if they are used for too long in the same area. Your physician has selected the right strength medicine for your problem and area affected on the body. Please use your medication only as directed by your physician to prevent side effects.   Due to recent changes in healthcare laws, you may see results of your pathology and/or laboratory studies on MyChart before the doctors have had a chance to review them. We understand that in some cases there may be results that are confusing or concerning to you. Please understand that not all results are received at the same time and often the doctors may need to interpret multiple results in order to provide you with the best plan of care or course of treatment.  Therefore, we ask that you please give [place] 2 business days to thoroughly review all your results before contacting the office for clarification. Should we see a critical lab result, you will be contacted sooner.   If You Need Anything After Your Visit  If you have any questions or concerns for your doctor, please call our main line at [phone] and press option 4 to reach your doctor's medical assistant. If no one answers, please leave a voicemail as directed and we will return your call as soon as possible. Messages left after 4 pm will be answered the following business day.   You may also send [place] a message via MyChart. We typically respond to MyChart messages within 1-2 business days.  For prescription refills, please ask your pharmacy to contact our office. Our fax number is [phone].  If you have an urgent issue when the clinic is closed that cannot wait until the next business day, you can page your doctor at the number below.    Please note that while we do our best to be available for urgent issues outside of office hours, we are not available 24/7.   If you have an urgent issue and are unable to reach [place], you may choose to seek medical care at your doctor's office, retail clinic, urgent care center, or emergency room.  If you have a medical emergency, please immediately call 911 or go to the emergency department.  Pager Numbers  - Dr. Gwen Pounds: [phone]  - Dr. Neale Burly: [phone]  - Dr. Roseanne Reno: [phone]  In the event of inclement weather, please call our main line  at [phone] for an update on the status of any delays or closures.  Dermatology Medication Tips: Please keep the boxes that topical medications come in in order to help keep track of the instructions about where and how to use these. Pharmacies typically print the medication instructions only on the boxes and not directly on the medication tubes.   If your medication is too expensive, please  contact our office at [phone] option 4 or send [place] a message through MyChart.   We are unable to tell what your co-pay for medications will be in advance as this is different depending on your insurance coverage. However, we may be able to find a substitute medication at lower cost or fill out paperwork to get insurance to cover a needed medication.   If a prior authorization is required to get your medication covered by your insurance company, please allow [place] 1-2 business days to complete this process.  Drug prices often vary depending on where the prescription is filled and some pharmacies may offer cheaper prices.  The website www.goodrx.com contains coupons for medications through different pharmacies. The prices here do not account for what the cost may be with help from insurance (it may be cheaper with your insurance), but the website can give you the price if you did not use any insurance.  - You can print the associated coupon and take it with your prescription to the pharmacy.  - You may also stop by our office during regular business hours and pick up a GoodRx coupon card.  - If you need your prescription sent electronically to a different pharmacy, notify our office through [facility] or by phone at [phone] option 4.     Si Usted Necesita Algo Despus de Su Visita  Tambin puede enviarnos un mensaje a travs de Clinical cytogeneticist. Por lo general respondemos a los mensajes de MyChart en el transcurso de 1 a 2 das hbiles.  Para renovar recetas, por favor pida a su farmacia que se ponga en contacto con nuestra oficina. Annie Sable de fax es [place] [phone].  Si tiene un asunto urgente cuando la clnica est cerrada y que no puede esperar hasta el siguiente da hbil, puede llamar/localizar a su doctor(a) al nmero que aparece a continuacin.   Por favor, tenga en cuenta que aunque hacemos todo lo posible para estar disponibles para asuntos urgentes fuera del horario de  [place], no estamos disponibles las 24 horas del da, los 7 [address]  [address] de la [place].   Si tiene un problema urgente y no puede comunicarse con nosotros, puede optar por buscar atencin mdica  en el consultorio de su doctor(a), en una clnica privada, en un centro de atencin urgente o en una sala de emergencias.  Si tiene Engineer, drilling, por favor llame inmediatamente al 911 o vaya a la sala de emergencias.  Nmeros de bper  - Dr. Gwen Pounds: [phone]  - Dra. Moye: [phone]  - Dra. Roseanne Reno: [phone]  En caso de inclemencias del [place], por favor llame a Lacy Duverney principal al [phone] para una actualizacin sobre el [place] de cualquier retraso o cierre.  Consejos para la medicacin en dermatologa: Por favor, guarde las cajas en las que vienen los medicamentos de uso tpico para ayudarle a seguir las instrucciones sobre dnde y cmo usarlos. Las farmacias generalmente imprimen las instrucciones del medicamento slo en las cajas y no directamente en los tubos del [place].   Si su medicamento es [employer], por favor, pngase en contacto  con Rolm Gala llamando al [phone] y presione la opcin 4 o envenos un mensaje a travs de Clinical cytogeneticist.   No podemos decirle cul ser su copago por los medicamentos por adelantado ya que esto es diferente dependiendo de la cobertura de su seguro. Sin embargo, es posible que podamos encontrar un medicamento sustituto a Audiological scientist un formulario para que el seguro cubra el medicamento que se considera necesario.   Si se requiere una autorizacin previa para que su compaa de seguros [place] su medicamento, por favor permtanos de 1 a 2 das hbiles para completar [address]  Los precios de los medicamentos varan con frecuencia dependiendo del Environmental consultant de dnde se surte la receta y alguna farmacias pueden ofrecer precios ms baratos.  El sitio web www.goodrx.com tiene cupones para medicamentos de Health and safety inspector. Los  precios aqu no tienen en cuenta lo que podra costar con la ayuda del seguro (puede ser ms barato con su seguro), pero el sitio web puede darle el precio si no utiliz Tourist information centre manager.  - Puede imprimir el cupn correspondiente y llevarlo con su receta a la farmacia.  - Tambin puede pasar por nuestra oficina durante el horario de atencin regular y Education officer, museum una tarjeta de cupones de GoodRx.  - Si necesita que su receta se enve electrnicamente a una farmacia diferente, informe a nuestra oficina a travs de MyChart de [place] o por telfono llamando al [phone] y presione la opcin 4.

## 2022-05-24 NOTE — Progress Notes (Signed)
Follow-Up Visit   Subjective  Janet Long is a 21 y.o. female who presents for the following: bruises (Right lower leg. Dur: 1 year. Does not itch. One lesion is painful when pressed. PCP gave Triamcinolone 0.1% cream, did not seem to be helping and made areas look darker. No personal or family history of diabetes) and Acne (Face. 10 month recheck. Is using Adapalene gel as directed. Stopped taking Spironolactone, was not helping very much. Still on OCP).    The following portions of the chart were reviewed this encounter and updated as appropriate:      Review of Systems: No other skin or systemic complaints except as noted in HPI or Assessment and Plan.   Objective  Well appearing patient in no apparent distress; mood and affect are within normal limits.  A focused examination was performed including face, right lower leg. Relevant physical exam findings are noted in the Assessment and Plan.  Right Lower Leg - Anterior Yellow/tan, slightly elevated plaques       face Few closed comedones at forehead, chin, nose. 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 Condition is bothersome/symptomatic for patient. Currently flared.   Advised patient there is no cure for this condition.  Goal of treatment is to prevent progression. Areas will always remain discolored, even when not active/inflamed. Can be associated with DM or prediabetes  Will check HBA1c and BMP (fasting Glu)  Start Clobetasol 0.05% cream twice daily to aas L lower leg. Apply first. Avoid applying to face, groin, and axilla. Use as directed. Long-term use can cause thinning of the skin.  Start Tacrolimus 0.1% ointment twice daily to aas R lower leg. Apply second.  Consider ILK, +/- plaquenil at next visit if not improving.   Topical steroids (such as triamcinolone,  fluocinolone, fluocinonide, mometasone, clobetasol, halobetasol, betamethasone, hydrocortisone) can cause thinning and lightening of the skin if they are used for too long in the same area. Your physician has selected the right strength medicine for your problem and area affected on the body. Please use your medication only as directed by your physician to prevent side effects.      tacrolimus (PROTOPIC) 0.1 % ointment - Right Lower Leg - Anterior Apply once daily to affected areas on leg  clobetasol cream (TEMOVATE) 0.05 % - Right Lower Leg - Anterior Apply once daily to affected areas on leg. Avoid applying to face, groin, and axilla.  Hemoglobin A1c - Right Lower Leg - Anterior  Basic Metabolic Panel - Right Lower Leg - Anterior  Acne vulgaris face  Chronic and persistent condition with duration or expected duration over one year. Condition is symptomatic / bothersome to patient. Not to goal.   Continue Adapalene 0.3% gel to face at bedtime  Start Aczone 7.5% gel to face every morning  Continue oral birth control pills  Topical retinoid medications like tretinoin/Retin-A, adapalene/Differin, tazarotene/Fabior, and Epiduo/Epiduo Forte can cause dryness and irritation when first started. Only apply a pea-sized amount to the entire affected area. Avoid applying it around the eyes, edges of mouth and creases at the nose. If you experience irritation, use a good moisturizer first and/or apply the medicine less often. If you are doing well with the medicine, you can increase how often you use it until you are applying every night. Be careful with sun protection while using this medication as  it can make you sensitive to the sun. This medicine should not be used by pregnant women.    Dapsone (ACZONE) 7.5 % GEL - face Apply qam to face, wash off qhs  Related Medications spironolactone (ALDACTONE) 100 MG tablet Take 1 tablet (100 mg total) by mouth daily.  Adapalene 0.3 % gel APPLY ONE  THIN FILM TOPICALLY AT BEDTIME  [name] 3-0.02 MG tablet TAKE ONE TABLET BY MOUTH DAILY   Return in about 4 weeks (around 06/21/2022) for NLD Follow Up.  I, Lawson Radar, CMA, am acting as scribe for Willeen Niece, MD.  Documentation: I have reviewed the above documentation for accuracy and completeness, and I agree with the above.  Willeen Niece MD

## 2022-05-25 ENCOUNTER — Other Ambulatory Visit: Payer: Self-pay

## 2022-05-25 DIAGNOSIS — L921 Necrobiosis lipoidica, not elsewhere classified: Secondary | ICD-10-CM

## 2022-05-25 MED ORDER — CLOBETASOL PROPIONATE 0.05 % EX CREA: TOPICAL_CREAM | CUTANEOUS | 2 refills | Status: DC

## 2022-05-25 MED ORDER — TACROLIMUS 0.1 % EX OINT: TOPICAL_OINTMENT | CUTANEOUS | 2 refills | Status: DC

## 2022-05-25 NOTE — Progress Notes (Signed)
Patient called requesting both Tacrolimus and Clobetasol be sent to [facility] in [place].   She did not want to change Dapsone Gel at this time. aw

## 2022-05-30 DIAGNOSIS — L921 Necrobiosis lipoidica, not elsewhere classified: Secondary | ICD-10-CM | POA: Diagnosis not present

## 2022-05-31 ENCOUNTER — Telehealth: Payer: Self-pay

## 2022-05-31 LAB — BASIC METABOLIC PANEL
BUN/Creatinine Ratio: 11 (ref 9–23)
BUN: 10 mg/dL (ref 6–20)
CO2: 18 mmol/L — ABNORMAL LOW (ref 20–29)
Calcium: 8.8 mg/dL (ref 8.7–10.2)
Chloride: 106 mmol/L (ref 96–106)
Creatinine, Ser: 0.91 mg/dL (ref 0.57–1.00)
Glucose: 86 mg/dL (ref 70–99)
Potassium: 4.5 mmol/L (ref 3.5–5.2)
Sodium: 138 mmol/L (ref 134–144)
eGFR: 92 mL/min/{1.73_m2} (ref >59)

## 2022-05-31 LAB — HEMOGLOBIN A1C
Est. average glucose Bld gHb Est-mCnc: 108 mg/dL
Hgb A1c MFr Bld: 5.4 % (ref 4.8–5.6)

## 2022-05-31 NOTE — Telephone Encounter (Signed)
Left pt msg to call for lab results/sh 

## 2022-05-31 NOTE — Telephone Encounter (Signed)
-----   Message from Tara Stewart, MD sent at 05/31/2022  2:51 PM EDT ----- Glucose and HbA1c are both wnl, no evidence of diabetes  - please call patient 

## 2022-06-08 ENCOUNTER — Telehealth: Payer: Self-pay

## 2022-06-08 NOTE — Telephone Encounter (Signed)
-----   Message from Willeen Niece, MD sent at 05/31/2022  2:51 PM EDT ----- Glucose and HbA1c are both wnl, no evidence of diabetes  - please call patient

## 2022-06-08 NOTE — Telephone Encounter (Signed)
Lft pt msg to call for lab result/sh

## 2022-06-14 ENCOUNTER — Ambulatory Visit: Payer: BC Managed Care – PPO | Admitting: Dermatology

## 2022-06-14 ENCOUNTER — Encounter: Payer: Self-pay | Admitting: Dermatology

## 2022-06-14 DIAGNOSIS — L921 Necrobiosis lipoidica, not elsewhere classified: Secondary | ICD-10-CM

## 2022-06-14 NOTE — Patient Instructions (Addendum)
For 3 more weeks: Clobetasol 0.05% cream twice daily to left lower leg. Apply first. Avoid applying to face, groin, and axilla. Use as directed. Long-term use can cause thinning of the skin.   Tacrolimus 0.1% ointment twice daily to right lower leg. Apply second.  After 3 weeks: Use Tacrolimus 0.1% ointment once a day on week days.  Use Clobetasol once a day on weekends only.  Recommend daily broad spectrum sunscreen SPF 30+ to sun-exposed areas, reapply every 2 hours as needed. Call for new or changing lesions.  Staying in the shade or wearing long sleeves, sun glasses (UVA+UVB protection) and wide brim hats (4-inch brim around the entire circumference of the hat) are also recommended for sun protection.    Topical steroids (such as triamcinolone, fluocinolone, fluocinonide, mometasone, clobetasol, halobetasol, betamethasone, hydrocortisone) can cause thinning and lightening of the skin if they are used for too long in the same area. Your physician has selected the right strength medicine for your problem and area affected on the body. Please use your medication only as directed by your physician to prevent side effects.     Due to recent changes in healthcare laws, you may see results of your pathology and/or laboratory studies on MyChart before the doctors have had a chance to review them. We understand that in some cases there may be results that are confusing or concerning to you. Please understand that not all results are received at the same time and often the doctors may need to interpret multiple results in order to provide you with the best plan of care or course of treatment. Therefore, we ask that you please give [place] 2 business days to thoroughly review all your results before contacting the office for clarification. Should we see a critical lab result, you will be contacted sooner.   If You Need Anything After Your Visit  If you have any questions or concerns for your doctor, please  call our main line at [phone] and press option 4 to reach your doctor's medical assistant. If no one answers, please leave a voicemail as directed and we will return your call as soon as possible. Messages left after 4 pm will be answered the following business day.   You may also send [place] a message via MyChart. We typically respond to MyChart messages within 1-2 business days.  For prescription refills, please ask your pharmacy to contact our office. Our fax number is [phone].  If you have an urgent issue when the clinic is closed that cannot wait until the next business day, you can page your doctor at the number below.    Please note that while we do our best to be available for urgent issues outside of office hours, we are not available 24/7.   If you have an urgent issue and are unable to reach [place], you may choose to seek medical care at your doctor's office, retail clinic, urgent care center, or emergency room.  If you have a medical emergency, please immediately call 911 or go to the emergency department.  Pager Numbers  - Dr. Gwen Pounds: [phone]  - Dr. Neale Burly: [phone]  - Dr. Roseanne Reno: [phone]  In the event of inclement weather, please call our main line at [phone] for an update on the status of any delays or closures.  Dermatology Medication Tips: Please keep the boxes that topical medications come in in order to help keep track of the instructions about where and how to use these. Pharmacies typically print the  medication instructions only on the boxes and not directly on the medication tubes.   If your medication is too expensive, please contact our office at [phone] option 4 or send [place] a message through MyChart.   We are unable to tell what your co-pay for medications will be in advance as this is different depending on your insurance coverage. However, we may be able to find a substitute medication at lower cost or fill out paperwork to get  insurance to cover a needed medication.   If a prior authorization is required to get your medication covered by your insurance company, please allow [place] 1-2 business days to complete this process.  Drug prices often vary depending on where the prescription is filled and some pharmacies may offer cheaper prices.  The website www.goodrx.com contains coupons for medications through different pharmacies. The prices here do not account for what the cost may be with help from insurance (it may be cheaper with your insurance), but the website can give you the price if you did not use any insurance.  - You can print the associated coupon and take it with your prescription to the pharmacy.  - You may also stop by our office during regular business hours and pick up a GoodRx coupon card.  - If you need your prescription sent electronically to a different pharmacy, notify our office through [facility] or by phone at [phone] option 4.     Si Usted Necesita Algo Despus de Su Visita  Tambin puede enviarnos un mensaje a travs de Clinical cytogeneticist. Por lo general respondemos a los mensajes de MyChart en el transcurso de 1 a 2 das hbiles.  Para renovar recetas, por favor pida a su farmacia que se ponga en contacto con nuestra oficina. Annie Sable de fax es [place] [phone].  Si tiene un asunto urgente cuando la clnica est cerrada y que no puede esperar hasta el siguiente da hbil, puede llamar/localizar a su doctor(a) al nmero que aparece a continuacin.   Por favor, tenga en cuenta que aunque hacemos todo lo posible para estar disponibles para asuntos urgentes fuera del horario de [place], no estamos disponibles las 24 horas del da, los 7 [address]  [address] de la [place].   Si tiene un problema urgente y no puede comunicarse con nosotros, puede optar por buscar atencin mdica  en el consultorio de su doctor(a), en una clnica privada, en un centro de atencin urgente o en una sala de emergencias.  Si  tiene Engineer, drilling, por favor llame inmediatamente al 911 o vaya a la sala de emergencias.  Nmeros de bper  - Dr. Gwen Pounds: [phone]  - Dra. Moye: [phone]  - Dra. Roseanne Reno: [phone]  En caso de inclemencias del [place], por favor llame a Lacy Duverney principal al [phone] para una actualizacin sobre el [place] de cualquier retraso o cierre.  Consejos para la medicacin en dermatologa: Por favor, guarde las cajas en las que vienen los medicamentos de uso tpico para ayudarle a seguir las instrucciones sobre dnde y cmo usarlos. Las farmacias generalmente imprimen las instrucciones del medicamento slo en las cajas y no directamente en los tubos del [place].   Si su medicamento es muy caro, por favor, pngase en contacto con Rolm Gala llamando al [phone] y presione la opcin 4 o envenos un mensaje a travs de Clinical cytogeneticist.   No podemos decirle cul ser su copago por los medicamentos por adelantado ya que esto es diferente dependiendo de la cobertura de su seguro. Sin  embargo, es posible que podamos encontrar un medicamento sustituto a Audiological scientist un formulario para que el seguro cubra el medicamento que se considera necesario.   Si se requiere una autorizacin previa para que su compaa de seguros [place] su medicamento, por favor permtanos de 1 a 2 das hbiles para completar [address]  Los precios de los medicamentos varan con frecuencia dependiendo del Environmental consultant de dnde se surte la receta y alguna farmacias pueden ofrecer precios ms baratos.  El sitio web www.goodrx.com tiene cupones para medicamentos de Health and safety inspector. Los precios aqu no tienen en cuenta lo que podra costar con la ayuda del seguro (puede ser ms barato con su seguro), pero el sitio web puede darle el precio si no utiliz Tourist information centre manager.  - Puede imprimir el cupn correspondiente y llevarlo con su receta a la farmacia.  - Tambin puede pasar por nuestra oficina  durante el horario de atencin regular y Education officer, museum una tarjeta de cupones de GoodRx.  - Si necesita que su receta se enve electrnicamente a una farmacia diferente, informe a nuestra oficina a travs de MyChart de [place] o por telfono llamando al [phone] y presione la opcin 4.

## 2022-06-14 NOTE — Progress Notes (Signed)
   Follow-Up Visit   Subjective  Janet Long is a 21 y.o. female who presents for the following: Rash (1 month recheck necrobiosis lipoidica. Using Clobetasol cream and Tacrolimus ointment. Thinks areas are smoother).  HbA1c and fasting blood glucose was normal.    The following portions of the chart were reviewed this encounter and updated as appropriate:      Review of Systems: No other skin or systemic complaints except as noted in HPI or Assessment and Plan.   Objective  Well appearing patient in no apparent distress; mood and affect are within normal limits.  A focused examination was performed including right lower leg. Relevant physical exam findings are noted in the Assessment and Plan.  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 Condition is symptomatic / bothersome to patient. Improving but not yet to goal.   For 3 more weeks: Clobetasol 0.05% cream once daily to aas left lower leg. Apply first. Avoid applying to face, groin, and axilla. Use as directed. Long-term use can cause thinning of the skin.   Tacrolimus 0.1% ointment once daily to right lower leg. Apply second.  After 3 weeks: Use Tacrolimus 0.1% ointment once a day on week days.  Use Clobetasol once a day on weekends only.  Related Medications clobetasol cream (TEMOVATE) 0.05 % Apply once daily to affected areas on leg. Avoid applying to face, groin, and axilla.  tacrolimus (PROTOPIC) 0.1 % ointment Apply once daily to affected areas on leg   Return in about 3 months (around 09/14/2022) for NLD recheck.  I, Lawson Radar, CMA, am acting as scribe for Willeen Niece, MD.  Documentation: I have reviewed the above documentation for accuracy and completeness, and I agree with the above.  Willeen Niece MD

## 2022-06-19 ENCOUNTER — Telehealth: Payer: Self-pay

## 2022-06-19 NOTE — Telephone Encounter (Addendum)
Called and spoke with patient regarding lab results. Patient verbalized understanding and denied questions at this time ----- Message from Willeen Niece, MD sent at 05/31/2022  2:51 PM EDT ----- Glucose and HbA1c are both wnl, no evidence of diabetes  - please call patient

## 2022-07-04 ENCOUNTER — Other Ambulatory Visit: Payer: Self-pay | Admitting: Dermatology

## 2022-07-04 DIAGNOSIS — L700 Acne vulgaris: Secondary | ICD-10-CM

## 2022-07-24 DIAGNOSIS — N760 Acute vaginitis: Secondary | ICD-10-CM | POA: Diagnosis not present

## 2022-09-26 ENCOUNTER — Encounter: Payer: BC Managed Care – PPO | Admitting: Dermatology

## 2022-09-26 NOTE — Progress Notes (Deleted)
   Follow-Up Visit   Subjective  Janet Long is a 21 y.o. female who presents for the following: necrobiosis lipoidica (3 month f/u. ).  ***  The following portions of the chart were reviewed this encounter and updated as appropriate:      {Review of Systems:34166::"No other skin or systemic complaints."}  Objective  Well appearing patient in no apparent distress; mood and affect are within normal limits.  {OKHT:97741::"S full examination was performed including scalp, head, eyes, ears, nose, lips, neck, chest, axillae, abdomen, back, buttocks, bilateral upper extremities, bilateral lower extremities, hands, feet, fingers, toes, fingernails, and toenails. All findings within normal limits unless otherwise noted below."}   Assessment & Plan   No follow-ups on file.

## 2022-10-03 ENCOUNTER — Ambulatory Visit: Payer: BC Managed Care – PPO | Admitting: Dermatology

## 2022-10-03 DIAGNOSIS — L921 Necrobiosis lipoidica, not elsewhere classified: Secondary | ICD-10-CM

## 2022-10-03 NOTE — Progress Notes (Signed)
   Follow-Up Visit   Subjective  Janet Long is a 21 y.o. female who presents for the following: necrobiosis lipoidica (Patient here is 3 month follow up. Patient was prescribed clobetasol cream to use on weekend only and tacrolimus to use on week days. Patient reports clobetasol cream helps when uses but has noticed darkening and spreading at area when she discontinues. Would like to discuss other treatments. ).  The following portions of the chart were reviewed this encounter and updated as appropriate:      Review of Systems: No other skin or systemic complaints except as noted in HPI or Assessment and Plan.   Objective  Well appearing patient in no apparent distress; mood and affect are within normal limits.  A focused examination was performed including right pretibial . Relevant physical exam findings are noted in the Assessment and Plan.  right pretibial Hypopigmented smooth patches with surrounding pink tan pigmentation enlarging of the superior lesion and new lesions on mid pretibia when compared to baseline photos.        Assessment & Plan  Necrobiosis lipoidica right pretibial  Chronic and persistent condition with duration or expected duration over one year. Condition is bothersome/symptomatic for patient. Currently flared.   Increase Clobetasol 0.05% cream to once daily to aas right lower leg. Use 4 weeks Apply first. Avoid applying to face, groin, and axilla. Use as directed. Long-term use can cause thinning of the skin.   Continue Tacrolimus 0.1% ointment once daily to right lower leg. Apply second.  Patient instructed if still flared can increase both cream treatments to twice daily.    After  4 weeks if improved Use Tacrolimus 0.1% ointment once a day on week days. Use clobetasol 0.05 % cream use once a day on weekend only. Avoid applying to face, groin, and axilla. Use as directed. Long-term use can cause thinning of the skin.  Topical steroids (such as  triamcinolone, fluocinolone, fluocinonide, mometasone, clobetasol, halobetasol, betamethasone, hydrocortisone) can cause thinning and lightening of the skin if they are used for too long in the same area. Your physician has selected the right strength medicine for your problem and area affected on the body. Please use your medication only as directed by your physician to prevent side effects.   Discussed intralesional injections if not improving with topical treatment  Related Medications clobetasol cream (TEMOVATE) 0.05 % Apply once daily to affected areas on leg. Avoid applying to face, groin, and axilla.  tacrolimus (PROTOPIC) 0.1 % ointment Apply once daily to affected areas on leg   Return for 1 month follow up.  I, Ruthell Rummage, CMA, am acting as scribe for Brendolyn Patty, MD.  Documentation: I have reviewed the above documentation for accuracy and completeness, and I agree with the above.  Brendolyn Patty MD

## 2022-10-03 NOTE — Patient Instructions (Addendum)
For 4 weeks   Continue Clobetasol 0.05% cream once daily to affected areas right lower leg. Use 4 weeks Apply first. Avoid applying to face, groin, and axilla. Use as directed. Long-term use can cause thinning of the skin.   Continue Tacrolimus 0.1% ointment once to right lower leg. Apply second.   If you don't feel like once a day is working can increase to twice daily    After  4 weeks  Use Tacrolimus 0.1% ointment once a day on week days. Use clobetasol 0.05 % cream use once a day on weekend only. Avoid applying to face, groin, and axilla. Use as directed. Long-term use can cause thinning of the skin.  Topical steroids (such as triamcinolone, fluocinolone, fluocinonide, mometasone, clobetasol, halobetasol, betamethasone, hydrocortisone) can cause thinning and lightening of the skin if they are used for too long in the same area. Your physician has selected the right strength medicine for your problem and area affected on the body. Please use your medication only as directed by your physician to prevent side effects.      Due to recent changes in healthcare laws, you may see results of your pathology and/or laboratory studies on MyChart before the doctors have had a chance to review them. We understand that in some cases there may be results that are confusing or concerning to you. Please understand that not all results are received at the same time and often the doctors may need to interpret multiple results in order to provide you with the best plan of care or course of treatment. Therefore, we ask that you please give [place] 2 business days to thoroughly review all your results before contacting the office for clarification. Should we see a critical lab result, you will be contacted sooner.   If You Need Anything After Your Visit  If you have any questions or concerns for your doctor, please call our main line at [phone] and press option 4 to reach your doctor's medical assistant. If no  one answers, please leave a voicemail as directed and we will return your call as soon as possible. Messages left after 4 pm will be answered the following business day.   You may also send [place] a message via MyChart. We typically respond to MyChart messages within 1-2 business days.  For prescription refills, please ask your pharmacy to contact our office. Our fax number is [phone].  If you have an urgent issue when the clinic is closed that cannot wait until the next business day, you can page your doctor at the number below.    Please note that while we do our best to be available for urgent issues outside of office hours, we are not available 24/7.   If you have an urgent issue and are unable to reach [place], you may choose to seek medical care at your doctor's office, retail clinic, urgent care center, or emergency room.  If you have a medical emergency, please immediately call 911 or go to the emergency department.  Pager Numbers  - Dr. Gwen Pounds: [phone]  - Dr. Neale Burly: [phone]  - Dr. Roseanne Reno: [phone]  In the event of inclement weather, please call our main line at [phone] for an update on the status of any delays or closures.  Dermatology Medication Tips: Please keep the boxes that topical medications come in in order to help keep track of the instructions about where and how to use these. Pharmacies typically print the medication instructions only on the boxes and  not directly on the medication tubes.   If your medication is too expensive, please contact our office at [phone] option 4 or send [place] a message through [place].   We are unable to tell what your co-pay for medications will be in advance as this is different depending on your insurance coverage. However, we may be able to find a substitute medication at lower cost or fill out paperwork to get insurance to cover a needed medication.   If a prior authorization is required to get your medication  covered by your insurance company, please allow [place] 1-2 business days to complete this process.  Drug prices often vary depending on where the prescription is filled and some pharmacies may offer cheaper prices.  The website www.goodrx.com contains coupons for medications through different pharmacies. The prices here do not account for what the cost may be with help from insurance (it may be cheaper with your insurance), but the website can give you the price if you did not use any insurance.  - You can print the associated coupon and take it with your prescription to the pharmacy.  - You may also stop by our office during regular business hours and pick up a GoodRx coupon card.  - If you need your prescription sent electronically to a different pharmacy, notify our office through [facility] or by phone at [phone] option 4.     Si Usted Necesita Algo Despus de Su Visita  Tambin puede enviarnos un mensaje a travs de Pharmacist, community. Por lo general respondemos a los mensajes de MyChart en el transcurso de 1 a 2 das hbiles.  Para renovar recetas, por favor pida a su farmacia que se ponga en contacto con nuestra oficina. Harland Dingwall de fax es [place] [phone].  Si tiene un asunto urgente cuando la clnica est cerrada y que no puede esperar hasta el siguiente da hbil, puede llamar/localizar a su doctor(a) al nmero que aparece a continuacin.   Por favor, tenga en cuenta que aunque hacemos todo lo posible para estar disponibles para asuntos urgentes fuera del horario de [place], no estamos disponibles las 24 horas del da, los 7 das de la [place].   Si tiene un problema urgente y no puede comunicarse con nosotros, puede optar por buscar atencin mdica  en el consultorio de su doctor(a), en una clnica privada, en un centro de atencin urgente o en una sala de emergencias.  Si tiene Engineering geologist, por favor llame inmediatamente al 911 o vaya a la sala de  emergencias.  Nmeros de bper  - Dr. Nehemiah Massed: [phone]  - Dra. Moye: [phone]  - Dra. Nicole Kindred: [phone]  En caso de inclemencias del [place], por favor llame a Johnsie Kindred principal al [phone] para una actualizacin sobre el [place] de cualquier retraso o cierre.  Consejos para la medicacin en dermatologa: Por favor, guarde las cajas en las que vienen los medicamentos de uso tpico para ayudarle a seguir las instrucciones sobre dnde y cmo usarlos. Las farmacias generalmente imprimen las instrucciones del medicamento slo en las cajas y no directamente en los tubos del [place].   Si su medicamento es muy caro, por favor, pngase en contacto con Zigmund Daniel llamando al [phone] y presione la opcin 4 o envenos un mensaje a travs de Pharmacist, community.   No podemos decirle cul ser su copago por los medicamentos por adelantado ya que esto es diferente dependiendo de la cobertura de su seguro. Sin embargo, es posible que podamos encontrar un  medicamento sustituto a Audiological scientist un formulario para que el seguro cubra el medicamento que se considera necesario.   Si se requiere una autorizacin previa para que su compaa de seguros [place] su medicamento, por favor permtanos de 1 a 2 das hbiles para completar [address]  Los precios de los medicamentos varan con frecuencia dependiendo del Environmental consultant de dnde se surte la receta y alguna farmacias pueden ofrecer precios ms baratos.  El sitio web www.goodrx.com tiene cupones para medicamentos de Health and safety inspector. Los precios aqu no tienen en cuenta lo que podra costar con la ayuda del seguro (puede ser ms barato con su seguro), pero el sitio web puede darle el precio si no utiliz Tourist information centre manager.  - Puede imprimir el cupn correspondiente y llevarlo con su receta a la farmacia.  - Tambin puede pasar por nuestra oficina durante el horario de atencin regular y Education officer, museum una tarjeta de cupones de GoodRx.  - Si  necesita que su receta se enve electrnicamente a una farmacia diferente, informe a nuestra oficina a travs de MyChart de [place] o por telfono llamando al [phone] y presione la opcin 4.

## 2022-11-06 ENCOUNTER — Ambulatory Visit: Payer: BC Managed Care – PPO | Admitting: Dermatology

## 2022-11-07 ENCOUNTER — Ambulatory Visit: Payer: BC Managed Care – PPO | Admitting: Dermatology

## 2022-11-07 VITALS — BP 117/74

## 2022-11-07 DIAGNOSIS — L921 Necrobiosis lipoidica, not elsewhere classified: Secondary | ICD-10-CM

## 2022-11-07 NOTE — Progress Notes (Signed)
   Follow-Up Visit   Subjective  Janet Long is a 21 y.o. female who presents for the following: Follow-up.  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 She is using clobetasol cream and tacrolimus ointment each daily. Area is lighter at times, but darkens throughout the day.  Areas overall look better.  The following portions of the chart were reviewed this encounter and updated as appropriate:       Review of Systems:  No other skin or systemic complaints except as noted in HPI or Assessment and Plan.  Objective  Well appearing patient in no apparent distress; mood and affect are within normal limits.  A focused examination was performed including right pretibial. Relevant physical exam findings are noted in the Assessment and Plan.  right pretibia Violaceous brown patches with central hypopigmentation, with fading compared to previous photos.       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 Condition is symptomatic / bothersome to patient. Not to goal, but some improvement compared to previous photos.   New photos taken today.   Decrease clobetasol cream to AA right pretibia QD Monday-Friday. Apply first. Continue tacrolimus ointment to AA right pretibia QD 7 days/wk.  Topical steroids (such as triamcinolone, fluocinolone, fluocinonide, mometasone, clobetasol, halobetasol, betamethasone, hydrocortisone) can cause thinning and lightening of the skin if they are used for too long in the same area. Your physician has selected the right strength medicine for your problem and area affected on the body. Please use your medication only as directed by your physician to prevent side effects.     Related Medications clobetasol cream (TEMOVATE) 0.05 % Apply once daily to affected areas on leg. Avoid applying to face, groin, and  axilla.  tacrolimus (PROTOPIC) 0.1 % ointment Apply once daily to affected areas on leg   Return in about 2 months (around 01/08/2023) for necrobiosis lipoidica.  ICherlyn Labella, CMA, am acting as scribe for Willeen Niece, MD .  Documentation: I have reviewed the above documentation for accuracy and completeness, and I agree with the above.  Willeen Niece MD

## 2022-11-07 NOTE — Patient Instructions (Signed)
Clobetasol Cream - Apply to affected areas right leg once a day Monday - Friday. Tacrolimus Ointment - Apply to affected areas right leg once daily 7 days a week.   Topical steroids (such as triamcinolone, fluocinolone, fluocinonide, mometasone, clobetasol, halobetasol, betamethasone, hydrocortisone) can cause thinning and lightening of the skin if they are used for too long in the same area. Your physician has selected the right strength medicine for your problem and area affected on the body. Please use your medication only as directed by your physician to prevent side effects.    Due to recent changes in healthcare laws, you may see results of your pathology and/or laboratory studies on MyChart before the doctors have had a chance to review them. We understand that in some cases there may be results that are confusing or concerning to you. Please understand that not all results are received at the same time and often the doctors may need to interpret multiple results in order to provide you with the best plan of care or course of treatment. Therefore, we ask that you please give [place] 2 business days to thoroughly review all your results before contacting the office for clarification. Should we see a critical lab result, you will be contacted sooner.   If You Need Anything After Your Visit  If you have any questions or concerns for your doctor, please call our main line at [phone] and press option 4 to reach your doctor's medical assistant. If no one answers, please leave a voicemail as directed and we will return your call as soon as possible. Messages left after 4 pm will be answered the following business day.   You may also send [place] a message via MyChart. We typically respond to MyChart messages within 1-2 business days.  For prescription refills, please ask your pharmacy to contact our office. Our fax number is [phone].  If you have an urgent issue when the clinic is closed that cannot  wait until the next business day, you can page your doctor at the number below.    Please note that while we do our best to be available for urgent issues outside of office hours, we are not available 24/7.   If you have an urgent issue and are unable to reach [place], you may choose to seek medical care at your doctor's office, retail clinic, urgent care center, or emergency room.  If you have a medical emergency, please immediately call 911 or go to the emergency department.  Pager Numbers  - Dr. Gwen Pounds: [phone]  - Dr. Neale Burly: [phone]  - Dr. Roseanne Reno: [phone]  In the event of inclement weather, please call our main line at [phone] for an update on the status of any delays or closures.  Dermatology Medication Tips: Please keep the boxes that topical medications come in in order to help keep track of the instructions about where and how to use these. Pharmacies typically print the medication instructions only on the boxes and not directly on the medication tubes.   If your medication is too expensive, please contact our office at [phone] option 4 or send [place] a message through MyChart.   We are unable to tell what your co-pay for medications will be in advance as this is different depending on your insurance coverage. However, we may be able to find a substitute medication at lower cost or fill out paperwork to get insurance to cover a needed medication.   If a prior authorization is required to get  your medication covered by your insurance company, please allow [place] 1-2 business days to complete this process.  Drug prices often vary depending on where the prescription is filled and some pharmacies may offer cheaper prices.  The website www.goodrx.com contains coupons for medications through different pharmacies. The prices here do not account for what the cost may be with help from insurance (it may be cheaper with your insurance), but the website can give you the price  if you did not use any insurance.  - You can print the associated coupon and take it with your prescription to the pharmacy.  - You may also stop by our office during regular business hours and pick up a GoodRx coupon card.  - If you need your prescription sent electronically to a different pharmacy, notify our office through [facility] or by phone at [phone] option 4.     Si Usted Necesita Algo Despus de Su Visita  Tambin puede enviarnos un mensaje a travs de Clinical cytogeneticist. Por lo general respondemos a los mensajes de MyChart en el transcurso de 1 a 2 das hbiles.  Para renovar recetas, por favor pida a su farmacia que se ponga en contacto con nuestra oficina. Annie Sable de fax es [place] [phone].  Si tiene un asunto urgente cuando la clnica est cerrada y que no puede esperar hasta el siguiente da hbil, puede llamar/localizar a su doctor(a) al nmero que aparece a continuacin.   Por favor, tenga en cuenta que aunque hacemos todo lo posible para estar disponibles para asuntos urgentes fuera del horario de [place], no estamos disponibles las 24 horas del da, los 7 [address]  [address] de la [place].   Si tiene un problema urgente y no puede comunicarse con nosotros, puede optar por buscar atencin mdica  en el consultorio de su doctor(a), en una clnica privada, en un centro de atencin urgente o en una sala de emergencias.  Si tiene Engineer, drilling, por favor llame inmediatamente al 911 o vaya a la sala de emergencias.  Nmeros de bper  - Dr. Gwen Pounds: [phone]  - Dra. Moye: [phone]  - Dra. Roseanne Reno: [phone]  En caso de inclemencias del [place], por favor llame a Lacy Duverney principal al [phone] para una actualizacin sobre el [place] de cualquier retraso o cierre.  Consejos para la medicacin en dermatologa: Por favor, guarde las cajas en las que vienen los medicamentos de uso tpico para ayudarle a seguir las instrucciones sobre dnde y cmo usarlos.  Las farmacias generalmente imprimen las instrucciones del medicamento slo en las cajas y no directamente en los tubos del [place].   Si su medicamento es muy caro, por favor, pngase en contacto con Rolm Gala llamando al [phone] y presione la opcin 4 o envenos un mensaje a travs de Clinical cytogeneticist.   No podemos decirle cul ser su copago por los medicamentos por adelantado ya que esto es diferente dependiendo de la cobertura de su seguro. Sin embargo, es posible que podamos encontrar un medicamento sustituto a Audiological scientist un formulario para que el seguro cubra el medicamento que se considera necesario.   Si se requiere una autorizacin previa para que su compaa de seguros [place] su medicamento, por favor permtanos de 1 a 2 das hbiles para completar [address]  Los precios de los medicamentos varan con frecuencia dependiendo del Environmental consultant de dnde se surte la receta y alguna farmacias pueden ofrecer precios ms baratos.  El sitio web www.goodrx.com tiene cupones para medicamentos de Health and safety inspector. Los  precios aqu no tienen en cuenta lo que podra costar con la ayuda del seguro (puede ser ms barato con su seguro), pero el sitio web puede darle el precio si no utiliz Research scientist (physical sciences).  - Puede imprimir el cupn correspondiente y llevarlo con su receta a la farmacia.  - Tambin puede pasar por nuestra oficina durante el horario de atencin regular y Charity fundraiser una tarjeta de cupones de GoodRx.  - Si necesita que su receta se enve electrnicamente a una farmacia diferente, informe a nuestra oficina a travs de MyChart de  o por telfono llamando al [phone] y presione la opcin 4.

## 2022-12-02 DIAGNOSIS — B3731 Acute candidiasis of vulva and vagina: Secondary | ICD-10-CM | POA: Diagnosis not present

## 2022-12-02 DIAGNOSIS — Z6823 Body mass index (BMI) 23.0-23.9, adult: Secondary | ICD-10-CM | POA: Diagnosis not present

## 2022-12-18 ENCOUNTER — Other Ambulatory Visit: Payer: Self-pay | Admitting: Dermatology

## 2022-12-18 DIAGNOSIS — L700 Acne vulgaris: Secondary | ICD-10-CM

## 2022-12-21 ENCOUNTER — Other Ambulatory Visit: Payer: Self-pay

## 2022-12-21 ENCOUNTER — Telehealth: Payer: Self-pay

## 2022-12-21 DIAGNOSIS — L700 Acne vulgaris: Secondary | ICD-10-CM

## 2022-12-21 MED ORDER — ADAPALENE 0.3 % EX GEL: CUTANEOUS | 3 refills | Status: DC

## 2022-12-21 NOTE — Telephone Encounter (Signed)
Patient called requesting refills of OBC. I advised patient that Dr. Nicole Kindred typically doesn't prescribe birth control unless the patient is on Isotretinoin, and she should contact her PCP or GYN to set up refills. Estill Bamberg has already sent Dr. Nicole Kindred a message and once she responds we will contact the patient. Patient also requesting refill of Adapalene 0.3% gel and I sent that to [facility] off of [address].

## 2022-12-21 NOTE — Telephone Encounter (Signed)
Patient called asking for RF of birth control medication.  Okay to RF?

## 2022-12-25 ENCOUNTER — Other Ambulatory Visit: Payer: Self-pay

## 2022-12-25 DIAGNOSIS — L700 Acne vulgaris: Secondary | ICD-10-CM

## 2022-12-25 MED ORDER — DROSPIRENONE-ETHINYL ESTRADIOL 3-0.02 MG PO TABS: 1.0000 | ORAL_TABLET | Freq: Every day | ORAL | 2 refills | Status: AC

## 2022-12-25 NOTE — Progress Notes (Signed)
BC Rfs per Dr. Carrolyn Leigh telephone call note. aw

## 2022-12-25 NOTE — Telephone Encounter (Signed)
Patient was seen and prescribed Birth Control by another provider today. aw

## 2022-12-25 NOTE — Telephone Encounter (Signed)
Left msg advising pt to return my call.  Rfs sent in. aw

## 2023-01-08 ENCOUNTER — Ambulatory Visit: Payer: BC Managed Care – PPO | Admitting: Dermatology

## 2023-01-08 DIAGNOSIS — L921 Necrobiosis lipoidica, not elsewhere classified: Secondary | ICD-10-CM

## 2023-01-08 NOTE — Patient Instructions (Addendum)
Restart tacrolimus daily to affected areas at right pretibia.  May continue clobetasol to spot treat 2-3 times a week as needed. Avoid applying to face, groin, and axilla. Use as directed. Long-term use can cause thinning of the skin.  Topical steroids (such as triamcinolone, fluocinolone, fluocinonide, mometasone, clobetasol, halobetasol, betamethasone, hydrocortisone) can cause thinning and lightening of the skin if they are used for too long in the same area. Your physician has selected the right strength medicine for your problem and area affected on the body. Please use your medication only as directed by your physician to prevent side effects.    Due to recent changes in healthcare laws, you may see results of your pathology and/or laboratory studies on MyChart before the doctors have had a chance to review them. We understand that in some cases there may be results that are confusing or concerning to you. Please understand that not all results are received at the same time and often the doctors may need to interpret multiple results in order to provide you with the best plan of care or course of treatment. Therefore, we ask that you please give [place] 2 business days to thoroughly review all your results before contacting the office for clarification. Should we see a critical lab result, you will be contacted sooner.   If You Need Anything After Your Visit  If you have any questions or concerns for your doctor, please call our main line at [phone] and press option 4 to reach your doctor's medical assistant. If no one answers, please leave a voicemail as directed and we will return your call as soon as possible. Messages left after 4 pm will be answered the following business day.   You may also send [place] a message via [place]. We typically respond to MyChart messages within 1-2 business days.  For prescription refills, please ask your pharmacy to contact our office. Our fax number is  [phone].  If you have an urgent issue when the clinic is closed that cannot wait until the next business day, you can page your doctor at the number below.    Please note that while we do our best to be available for urgent issues outside of office hours, we are not available 24/7.   If you have an urgent issue and are unable to reach [place], you may choose to seek medical care at your doctor's office, retail clinic, urgent care center, or emergency room.  If you have a medical emergency, please immediately call 911 or go to the emergency department.  Pager Numbers  - Dr. Nehemiah Massed: [phone]  - Dr. Laurence Ferrari: [phone]  - Dr. Nicole Kindred: [phone]  In the event of inclement weather, please call our main line at [phone] for an update on the status of any delays or closures.  Dermatology Medication Tips: Please keep the boxes that topical medications come in in order to help keep track of the instructions about where and how to use these. Pharmacies typically print the medication instructions only on the boxes and not directly on the medication tubes.   If your medication is too expensive, please contact our office at [phone] option 4 or send [place] a message through [place].   We are unable to tell what your co-pay for medications will be in advance as this is different depending on your insurance coverage. However, we may be able to find a substitute medication at lower cost or fill out paperwork to get insurance to cover a needed medication.  If a prior authorization is required to get your medication covered by your insurance company, please allow [place] 1-2 business days to complete this process.  Drug prices often vary depending on where the prescription is filled and some pharmacies may offer cheaper prices.  The website www.goodrx.com contains coupons for medications through different pharmacies. The prices here do not account for what the cost may be with help from  insurance (it may be cheaper with your insurance), but the website can give you the price if you did not use any insurance.  - You can print the associated coupon and take it with your prescription to the pharmacy.  - You may also stop by our office during regular business hours and pick up a GoodRx coupon card.  - If you need your prescription sent electronically to a different pharmacy, notify our office through [facility] or by phone at [phone] option 4.     Si Usted Necesita Algo Despus de Su Visita  Tambin puede enviarnos un mensaje a travs de Pharmacist, community. Por lo general respondemos a los mensajes de MyChart en el transcurso de 1 a 2 das hbiles.  Para renovar recetas, por favor pida a su farmacia que se ponga en contacto con nuestra oficina. Harland Dingwall de fax es [place] [phone].  Si tiene un asunto urgente cuando la clnica est cerrada y que no puede esperar hasta el siguiente da hbil, puede llamar/localizar a su doctor(a) al nmero que aparece a continuacin.   Por favor, tenga en cuenta que aunque hacemos todo lo posible para estar disponibles para asuntos urgentes fuera del horario de [place], no estamos disponibles las 24 horas del da, los 7 das de la [place].   Si tiene un problema urgente y no puede comunicarse con nosotros, puede optar por buscar atencin mdica  en el consultorio de su doctor(a), en una clnica privada, en un centro de atencin urgente o en una sala de emergencias.  Si tiene Engineering geologist, por favor llame inmediatamente al 911 o vaya a la sala de emergencias.  Nmeros de bper  - Dr. Nehemiah Massed: [phone]  - Dra. Moye: [phone]  - Dra. Nicole Kindred: [phone]  En caso de inclemencias del [place], por favor llame a Johnsie Kindred principal al [phone] para una actualizacin sobre el [place] de cualquier retraso o cierre.  Consejos para la medicacin en dermatologa: Por favor, guarde las cajas en las que vienen los  medicamentos de uso tpico para ayudarle a seguir las instrucciones sobre dnde y cmo usarlos. Las farmacias generalmente imprimen las instrucciones del medicamento slo en las cajas y no directamente en los tubos del [place].   Si su medicamento es muy caro, por favor, pngase en contacto con Zigmund Daniel llamando al [phone] y presione la opcin 4 o envenos un mensaje a travs de Pharmacist, community.   No podemos decirle cul ser su copago por los medicamentos por adelantado ya que esto es diferente dependiendo de la cobertura de su seguro. Sin embargo, es posible que podamos encontrar un medicamento sustituto a Electrical engineer un formulario para que el seguro cubra el medicamento que se considera necesario.   Si se requiere una autorizacin previa para que su compaa de seguros [place] su medicamento, por favor permtanos de 1 a 2 das hbiles para completar este proceso.  Los precios de los medicamentos varan con frecuencia dependiendo del Environmental consultant de dnde se surte la receta y alguna farmacias pueden ofrecer precios ms baratos.  El sitio web www.goodrx.com  de diferentes farmacias. Los precios aqu no tienen en cuenta lo que podra costar con la ayuda del seguro (puede ser ms barato con su seguro), pero el sitio web puede darle el precio si no utiliz ningn seguro.  - Puede imprimir el cupn correspondiente y llevarlo con su receta a la farmacia.  - Tambin puede pasar por nuestra oficina durante el horario de atencin regular y recoger una tarjeta de cupones de GoodRx.  - Si necesita que su receta se enve electrnicamente a una farmacia diferente, informe a nuestra oficina a travs de MyChart de [place] o por telfono llamando al [phone] y presione la opcin 4.  

## 2023-01-08 NOTE — Progress Notes (Signed)
   Follow-Up Visit   Subjective  Janet Long is a 22 y.o. female who presents for the following: Necrobiosis Lipoidica (Patient here today for 2 month follow up at right pretibia. Patient currently only using clobetasol twice weekly. She has noticed that the skin at areas look like they are indented.).  Patient accompanied by mother.  The following portions of the chart were reviewed this encounter and updated as appropriate:       Review of Systems:  No other skin or systemic complaints except as noted in HPI or Assessment and Plan.  Objective  Well appearing patient in no apparent distress; mood and affect are within normal limits.  A focused examination was performed including right leg. Relevant physical exam findings are noted in the Assessment and Plan.  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 Condition is symptomatic / bothersome to patient. Not to goal, but some improvement compared to previous photos.   Restart tacrolimus 0.1% daily to affected areas at right pretibia.  May continue clobetasol to spot treat 2-3 times a week. Avoid applying to face, groin, and axilla. Use as directed. Long-term use can cause thinning of the skin.  Topical steroids (such as triamcinolone, fluocinolone, fluocinonide, mometasone, clobetasol, halobetasol, betamethasone, hydrocortisone) can cause thinning and lightening of the skin if they are used for too long in the same area. Your physician has selected the right strength medicine for your problem and area affected on the body. Please use your medication only as directed by your physician to prevent side effects.    Related Medications clobetasol cream (TEMOVATE) 0.05 % Apply once daily to affected areas on  leg. Avoid applying to face, groin, and axilla.  tacrolimus (PROTOPIC) 0.1 % ointment Apply once daily to affected areas on leg   Return for 3-4 month necrobiosis lipoidica.  Graciella Belton, RMA, am acting as scribe for Brendolyn Patty, MD .  Documentation: I have reviewed the above documentation for accuracy and completeness, and I agree with the above.  Brendolyn Patty MD

## 2023-01-17 NOTE — Progress Notes (Signed)
This encounter was created in error - please disregard.

## 2023-04-24 ENCOUNTER — Ambulatory Visit: Payer: BC Managed Care – PPO | Admitting: Dermatology

## 2023-04-24 VITALS — BP 110/73 | HR 83

## 2023-04-24 DIAGNOSIS — L921 Necrobiosis lipoidica, not elsewhere classified: Secondary | ICD-10-CM | POA: Diagnosis not present

## 2023-04-24 NOTE — Patient Instructions (Addendum)
Recommend using a sunscreen daily to affected area   Can continue tacrolimus 0.1% daily to affected areas at right pretibia on Monday through Friday.  Can decrease clobetasol to spot treat 2 times a week on Saturday and Sunday  Avoid applying to face, groin, and axilla. Use as directed.    Topical steroids (such as triamcinolone, fluocinolone, fluocinonide, mometasone, clobetasol, halobetasol, betamethasone, hydrocortisone) can cause thinning and lightening of the skin if they are used for too long in the same area. Your physician has selected the right strength medicine for your problem and area affected on the body. Please use your medication only as directed by your physician to prevent side effects.         Due to recent changes in healthcare laws, you may see results of your pathology and/or laboratory studies on MyChart before the doctors have had a chance to review them. We understand that in some cases there may be results that are confusing or concerning to you. Please understand that not all results are received at the same time and often the doctors may need to interpret multiple results in order to provide you with the best plan of care or course of treatment. Therefore, we ask that you please give [place] 2 business days to thoroughly review all your results before contacting the office for clarification. Should we see a critical lab result, you will be contacted sooner.   If You Need Anything After Your Visit  If you have any questions or concerns for your doctor, please call our main line at [phone] and press option 4 to reach your doctor's medical assistant. If no one answers, please leave a voicemail as directed and we will return your call as soon as possible. Messages left after 4 pm will be answered the following business day.   You may also send [place] a message via MyChart. We typically respond to MyChart messages within 1-2 business days.  For prescription refills, please  ask your pharmacy to contact our office. Our fax number is [phone].  If you have an urgent issue when the clinic is closed that cannot wait until the next business day, you can page your doctor at the number below.    Please note that while we do our best to be available for urgent issues outside of office hours, we are not available 24/7.   If you have an urgent issue and are unable to reach [place], you may choose to seek medical care at your doctor's office, retail clinic, urgent care center, or emergency room.  If you have a medical emergency, please immediately call 911 or go to the emergency department.  Pager Numbers  - Dr. Gwen Pounds: [phone]  - Dr. Neale Burly: [phone]  - Dr. Roseanne Reno: [phone]  In the event of inclement weather, please call our main line at [phone] for an update on the status of any delays or closures.  Dermatology Medication Tips: Please keep the boxes that topical medications come in in order to help keep track of the instructions about where and how to use these. Pharmacies typically print the medication instructions only on the boxes and not directly on the medication tubes.   If your medication is too expensive, please contact our office at [phone] option 4 or send [place] a message through MyChart.   We are unable to tell what your co-pay for medications will be in advance as this is different depending on your insurance coverage. However, we may be able to find a  substitute medication at lower cost or fill out paperwork to get insurance to cover a needed medication.   If a prior authorization is required to get your medication covered by your insurance company, please allow [place] 1-2 business days to complete this process.  Drug prices often vary depending on where the prescription is filled and some pharmacies may offer cheaper prices.  The website www.goodrx.com contains coupons for medications through different pharmacies. The prices here do  not account for what the cost may be with help from insurance (it may be cheaper with your insurance), but the website can give you the price if you did not use any insurance.  - You can print the associated coupon and take it with your prescription to the pharmacy.  - You may also stop by our office during regular business hours and pick up a GoodRx coupon card.  - If you need your prescription sent electronically to a different pharmacy, notify our office through [facility] or by phone at [phone] option 4.     Si Usted Necesita Algo Despus de Su Visita  Tambin puede enviarnos un mensaje a travs de Clinical cytogeneticist. Por lo general respondemos a los mensajes de MyChart en el transcurso de 1 a 2 das hbiles.  Para renovar recetas, por favor pida a su farmacia que se ponga en contacto con nuestra oficina. Annie Sable de fax es [place] [phone].  Si tiene un asunto urgente cuando la clnica est cerrada y que no puede esperar hasta el siguiente da hbil, puede llamar/localizar a su doctor(a) al nmero que aparece a continuacin.   Por favor, tenga en cuenta que aunque hacemos todo lo posible para estar disponibles para asuntos urgentes fuera del horario de [place], no estamos disponibles las 24 horas del da, los 7 [address]  [address] de la [place].   Si tiene un problema urgente y no puede comunicarse con nosotros, puede optar por buscar atencin mdica  en el consultorio de su doctor(a), en una clnica privada, en un centro de atencin urgente o en una sala de emergencias.  Si tiene Engineer, drilling, por favor llame inmediatamente al 911 o vaya a la sala de emergencias.  Nmeros de bper  - Dr. Gwen Pounds: [phone]  - Dra. Moye: [phone]  - Dra. Roseanne Reno: [phone]  En caso de inclemencias del [place], por favor llame a Lacy Duverney principal al [phone] para una actualizacin sobre el [place] de cualquier retraso o cierre.  Consejos para la medicacin en dermatologa: Por  favor, guarde las cajas en las que vienen los medicamentos de uso tpico para ayudarle a seguir las instrucciones sobre dnde y cmo usarlos. Las farmacias generalmente imprimen las instrucciones del medicamento slo en las cajas y no directamente en los tubos del [place].   Si su medicamento es muy caro, por favor, pngase en contacto con Rolm Gala llamando al [phone] y presione la opcin 4 o envenos un mensaje a travs de Clinical cytogeneticist.   No podemos decirle cul ser su copago por los medicamentos por adelantado ya que esto es diferente dependiendo de la cobertura de su seguro. Sin embargo, es posible que podamos encontrar un medicamento sustituto a Audiological scientist un formulario para que el seguro cubra el medicamento que se considera necesario.   Si se requiere una autorizacin previa para que su compaa de seguros [place] su medicamento, por favor permtanos de 1 a 2 das hbiles para completar [address]  Los precios de los medicamentos varan con frecuencia dependiendo del Environmental consultant  de dnde se surte la receta y alguna farmacias pueden ofrecer precios ms baratos.  El sitio web www.goodrx.com tiene cupones para medicamentos de Health and safety inspector. Los precios aqu no tienen en cuenta lo que podra costar con la ayuda del seguro (puede ser ms barato con su seguro), pero el sitio web puede darle el precio si no utiliz Tourist information centre manager.  - Puede imprimir el cupn correspondiente y llevarlo con su receta a la farmacia.  - Tambin puede pasar por nuestra oficina durante el horario de atencin regular y Education officer, museum una tarjeta de cupones de GoodRx.  - Si necesita que su receta se enve electrnicamente a Psychiatrist, informe a nuestra oficina a travs de MyChart de [place] o por telfono llamando al

## 2023-04-24 NOTE — Progress Notes (Signed)
   Follow-Up Visit   Subjective  Janet Long is a 22 y.o. female who presents for the following: 3 - 4 month necrobiosis lipoidica. Patient is using tacrolimus ointment nightly and clobetasol as a spot treatment to area 3 times weekly. Has seen some improvement. No new spots.  No itching.   The following portions of the chart were reviewed this encounter and updated as appropriate: medications, allergies, medical history  Review of Systems:  No other skin or systemic complaints except as noted in HPI or Assessment and Plan.  Objective  Well appearing patient in no apparent distress; mood and affect are within normal limits.   A focused examination was performed of the following areas: Right leg   Relevant exam findings are noted in the Assessment and Plan.        Assessment & Plan   Necrobiosis lipoidica right pretibia  Exam :  light violaceous/tan, atrophic plaques on right pretibia with no new active areas    Chronic and persistent condition with duration or expected duration over one year. Condition is symptomatic / bothersome to patient. Not to goal, but some improvement compared to previous photos.    Continue tacrolimus 0.1% daily to affected areas at right pretibia. M- F  can decrease clobetasol to spot treat 2 times a week Saturday and Sunday  May increase clobetasol to spot treat qd prn flares. Avoid applying to face, groin, and axilla. Use as directed. Long-term use can cause thinning of the skin.  Recommend sunscreen daily    Topical steroids (such as triamcinolone, fluocinolone, fluocinonide, mometasone, clobetasol, halobetasol, betamethasone, hydrocortisone) can cause thinning and lightening of the skin if they are used for too long in the same area. Your physician has selected the right strength medicine for your problem and area affected on the body. Please use your medication only as directed by your physician to prevent side effects.   Necrobiosis  lipoidica  Related Medications clobetasol cream (TEMOVATE) 0.05 % Apply once daily to affected areas on leg. Avoid applying to face, groin, and axilla.  tacrolimus (PROTOPIC) 0.1 % ointment Apply once daily to affected areas on leg    Return in about 6 months (around 10/25/2023) for necrobiosis lipoidica.  I, Asher Muir, CMA, am acting as scribe for Willeen Niece, MD.   Documentation: I have reviewed the above documentation for accuracy and completeness, and I agree with the above.  Willeen Niece, MD

## 2023-10-30 ENCOUNTER — Ambulatory Visit: Payer: BC Managed Care – PPO | Admitting: Dermatology

## 2024-02-18 ENCOUNTER — Ambulatory Visit (INDEPENDENT_AMBULATORY_CARE_PROVIDER_SITE_OTHER): Payer: Self-pay | Admitting: Dermatology

## 2024-02-18 DIAGNOSIS — L921 Necrobiosis lipoidica, not elsewhere classified: Secondary | ICD-10-CM | POA: Diagnosis not present

## 2024-02-18 DIAGNOSIS — L700 Acne vulgaris: Secondary | ICD-10-CM | POA: Diagnosis not present

## 2024-02-18 DIAGNOSIS — Z79899 Other long term (current) drug therapy: Secondary | ICD-10-CM

## 2024-02-18 MED ORDER — ADAPALENE 0.3 % EX GEL: CUTANEOUS | 3 refills | Status: AC

## 2024-02-18 MED ORDER — TACROLIMUS 0.1 % EX OINT: TOPICAL_OINTMENT | CUTANEOUS | 1 refills | Status: AC

## 2024-02-18 MED ORDER — CLOBETASOL PROPIONATE 0.05 % EX CREA: TOPICAL_CREAM | CUTANEOUS | 1 refills | Status: AC

## 2024-02-18 NOTE — Patient Instructions (Addendum)
 For legs: Use clobetasol 0.05% cream twice a day, let soak in and then use tacrolimus 0.1% ointment twice a day on top of clobetasol cream.   For acne: Continue Adapalene 0.3% gel, pea size amount at night. Recommend using moisturizer first before applying.     Due to recent changes in healthcare laws, you may see results of your pathology and/or laboratory studies on MyChart before the doctors have had a chance to review them. We understand that in some cases there may be results that are confusing or concerning to you. Please understand that not all results are received at the same time and often the doctors may need to interpret multiple results in order to provide you with the best plan of care or course of treatment. Therefore, we ask that you please give [place] 2 business days to thoroughly review all your results before contacting the office for clarification. Should we see a critical lab result, you will be contacted sooner.   If You Need Anything After Your Visit  If you have any questions or concerns for your doctor, please call our main line at [phone] and press option 4 to reach your doctor's medical assistant. If no one answers, please leave a voicemail as directed and we will return your call as soon as possible. Messages left after 4 pm will be answered the following business day.   You may also send [place] a message via MyChart. We typically respond to MyChart messages within 1-2 business days.  For prescription refills, please ask your pharmacy to contact our office. Our fax number is [phone].  If you have an urgent issue when the clinic is closed that cannot wait until the next business day, you can page your doctor at the number below.    Please note that while we do our best to be available for urgent issues outside of office hours, we are not available 24/7.   If you have an urgent issue and are unable to reach [place], you may choose to seek medical care at your doctor's  office, retail clinic, urgent care center, or emergency room.  If you have a medical emergency, please immediately call 911 or go to the emergency department.  Pager Numbers  - Dr. Gwen Pounds: [phone]  - Dr. Roseanne Reno: [phone]  - Dr. Katrinka Blazing: [phone]   In the event of inclement weather, please call our main line at [phone] for an update on the status of any delays or closures.  Dermatology Medication Tips: Please keep the boxes that topical medications come in in order to help keep track of the instructions about where and how to use these. Pharmacies typically print the medication instructions only on the boxes and not directly on the medication tubes.   If your medication is too expensive, please contact our office at [phone] option 4 or send [place] a message through MyChart.   We are unable to tell what your co-pay for medications will be in advance as this is different depending on your insurance coverage. However, we may be able to find a substitute medication at lower cost or fill out paperwork to get insurance to cover a needed medication.   If a prior authorization is required to get your medication covered by your insurance company, please allow [place] 1-2 business days to complete this process.  Drug prices often vary depending on where the prescription is filled and some pharmacies may offer cheaper prices.  The website www.goodrx.com contains coupons for medications through different pharmacies.  The prices here do not account for what the cost may be with help from insurance (it may be cheaper with your insurance), but the website can give you the price if you did not use any insurance.  - You can print the associated coupon and take it with your prescription to the pharmacy.  - You may also stop by our office during regular business hours and pick up a GoodRx coupon card.  - If you need your prescription sent electronically to a different pharmacy, notify our  office through [facility] or by phone at [phone] option 4.     Si Usted Necesita Algo Despus de Su Visita  Tambin puede enviarnos un mensaje a travs de Clinical cytogeneticist. Por lo general respondemos a los mensajes de MyChart en el transcurso de 1 a 2 das hbiles.  Para renovar recetas, por favor pida a su farmacia que se ponga en contacto con nuestra oficina. Annie Sable de fax es [place] [phone].  Si tiene un asunto urgente cuando la clnica est cerrada y que no puede esperar hasta el siguiente da hbil, puede llamar/localizar a su doctor(a) al nmero que aparece a continuacin.   Por favor, tenga en cuenta que aunque hacemos todo lo posible para estar disponibles para asuntos urgentes fuera del horario de [place], no estamos disponibles las 24 horas del da, los 7 [address]  [address] de la [place].   Si tiene un problema urgente y no puede comunicarse con nosotros, puede optar por buscar atencin mdica  en el consultorio de su doctor(a), en una clnica privada, en un centro de atencin urgente o en una sala de emergencias.  Si tiene Engineer, drilling, por favor llame inmediatamente al 911 o vaya a la sala de emergencias.  Nmeros de bper  - Dr. Gwen Pounds: [phone]  - Dra. Roseanne Reno: [phone]  - Dr. Katrinka Blazing: [phone]   En caso de inclemencias del tiempo, por favor llame a Lacy Duverney principal al [phone] para una actualizacin sobre el [place] de cualquier retraso o cierre.  Consejos para la medicacin en dermatologa: Por favor, guarde las cajas en las que vienen los medicamentos de uso tpico para ayudarle a seguir las instrucciones sobre dnde y cmo usarlos. Las farmacias generalmente imprimen las instrucciones del medicamento slo en las cajas y no directamente en los tubos del [place].   Si su medicamento es muy caro, por favor, pngase en contacto con Rolm Gala llamando al [phone] y presione la opcin 4 o envenos un mensaje a travs de Clinical cytogeneticist.    No podemos decirle cul ser su copago por los medicamentos por adelantado ya que esto es diferente dependiendo de la cobertura de su seguro. Sin embargo, es posible que podamos encontrar un medicamento sustituto a Audiological scientist un formulario para que el seguro cubra el medicamento que se considera necesario.   Si se requiere una autorizacin previa para que su compaa de seguros [place] su medicamento, por favor permtanos de 1 a 2 das hbiles para completar [address]  Los precios de los medicamentos varan con frecuencia dependiendo del Environmental consultant de dnde se surte la receta y alguna farmacias pueden ofrecer precios ms baratos.  El sitio web www.goodrx.com tiene cupones para medicamentos de Health and safety inspector. Los precios aqu no tienen en cuenta lo que podra costar con la ayuda del seguro (puede ser ms barato con su seguro), pero el sitio web puede darle el precio si no utiliz Tourist information centre manager.  - Puede imprimir el cupn correspondiente y llevarlo con su  receta a la farmacia.  - Tambin puede pasar por nuestra oficina durante el horario de atencin regular y Education officer, museum una tarjeta de cupones de GoodRx.  - Si necesita que su receta se enve electrnicamente a una farmacia diferente, informe a nuestra oficina a travs de MyChart de [place] o por telfono llamando al [phone] y presione la opcin 4.

## 2024-02-18 NOTE — Progress Notes (Signed)
 Follow-Up Visit   Subjective  Janet Long is a 23 y.o. female who presents for the following: Necrobiosis lipoidica follow up. Pt did go get a second opinion with another dermatologist and was prescribed betamethasone dipropionate 0.05% cream once daily and has used this for about 8-10 months, pt feels like it has not really improved since switching from using tacrolimus 0.1% daily and clobetasol cream 0.05% on weekends.   Pt also using Adapalene 0.3% gel to face at bedtime for acne and wanting to know if okay to continue using. Pt reports she has minimal flares since using.   The patient has spots, moles and lesions to be evaluated, some may be new or changing and the patient may have concern these could be cancer.   The following portions of the chart were reviewed this encounter and updated as appropriate: medications, allergies, medical history  Review of Systems:  No other skin or systemic complaints except as noted in HPI or Assessment and Plan.  Objective  Well appearing patient in no apparent distress; mood and affect are within normal limits.   A focused examination was performed of the following areas: Bilateral legs, face  Relevant exam findings are noted in the Assessment and Plan.           Assessment & Plan   Necrobiosis lipoidica Right pretibia   Exam: Faint yellow/tan smooth papules at L lateral leg, yellow/tan shiny plaque at right mid pretibia with mild erythema    Chronic and persistent condition with duration or expected duration over one year. Condition is bothersome/symptomatic for patient. Currently flared. New active area.    Treatment plan: Use clobetasol 0.05% cream BID in the mornings and evenings Avoid applying to face, groin, and axilla. Use as directed. Long-term use can cause thinning of the skin, let soak in and then use tacrolimus 0.1% ointment BID mornings and evenings on top of clobetasol cream. Do both for 4 weeks until f/up  appt.  Topical steroids (such as triamcinolone, fluocinolone, fluocinonide, mometasone, clobetasol, halobetasol, betamethasone, hydrocortisone) can cause thinning and lightening of the skin if they are used for too long in the same area. Your physician has selected the right strength medicine for your problem and area affected on the body. Please use your medication only as directed by your physician to prevent side effects.   Discussed steroid shots to help calm down inflammation, may cause discoloration, but can prevent spreading. Discussed if no improvement in 4 weeks with clobetasol and tacrolimus will consider injections instead.    Acne vulgaris Face  Exam: closed comedones on chin/forehead, T- zone area.   Chronic condition with duration or expected duration over one year. Currently well-controlled.    Treatment plan: Continue Adapalene 0.3% gel, pea size amount at night. Recommend using moisturizer first before applying.   Topical retinoid medications like tretinoin/Retin-A, adapalene/Differin, tazarotene/Fabior, and Epiduo/Epiduo Forte can cause dryness and irritation when first started. Only apply a pea-sized amount to the entire affected area. Avoid applying it around the eyes, edges of mouth and creases at the nose. If you experience irritation, use a good moisturizer first and/or apply the medicine less often. If you are doing well with the medicine, you can increase how often you use it until you are applying every night. Be careful with sun protection while using this medication as it can make you sensitive to the sun. This medicine should not be used by pregnant women.   ACNE VULGARIS   Related Medications spironolactone (ALDACTONE) 100  MG tablet Take 1 tablet (100 mg total) by mouth daily. Dapsone (ACZONE) 7.5 % GEL Apply qam to face, wash off qhs drospirenone-ethinyl estradiol ([name]) 3-0.02 MG tablet Take 1 tablet by mouth daily. Adapalene 0.3 % gel APPLY ONE THIN  FILM TOPICALLY AT BEDTIME NECROBIOSIS LIPOIDICA   Related Medications clobetasol cream (TEMOVATE) 0.05 % Apply BID to affected areas on leg. Avoid applying to face, groin, and axilla. tacrolimus (PROTOPIC) 0.1 % ointment Apply BID to affected areas on leg  Return in about 4 weeks (around 03/17/2024) for w/ Dr. Roseanne Reno for Necrobiosis lipoidica.  I, Soundra Pilon, CMA, am acting as scribe for Willeen Niece, MD .   Documentation: I have reviewed the above documentation for accuracy and completeness, and I agree with the above.  Willeen Niece, MD

## 2024-02-26 ENCOUNTER — Ambulatory Visit: Payer: Self-pay | Admitting: Dermatology

## 2024-03-26 ENCOUNTER — Ambulatory Visit: Admitting: Dermatology

## 2024-04-28 ENCOUNTER — Ambulatory Visit
Admission: EM | Admit: 2024-04-28 | Discharge: 2024-04-28 | Disposition: A | Discharge status: Home / Self Care | Attending: Family Medicine | Admitting: Family Medicine

## 2024-04-28 DIAGNOSIS — N3001 Acute cystitis with hematuria: Secondary | ICD-10-CM | POA: Diagnosis present

## 2024-04-28 LAB — POCT URINALYSIS DIP (MANUAL ENTRY)
Bilirubin, UA: NEGATIVE
Glucose, UA: NEGATIVE mg/dL
Ketones, POC UA: NEGATIVE mg/dL
Nitrite, UA: NEGATIVE
Protein Ur, POC: >=300 mg/dL — AB
Spec Grav, UA: 1.025 (ref 1.010–1.025)
Urobilinogen, UA: 0.2 U/dL
pH, UA: 7 (ref 5.0–8.0)

## 2024-04-28 LAB — POCT URINE PREGNANCY: Preg Test, Ur: NEGATIVE

## 2024-04-28 MED ORDER — NITROFURANTOIN MONOHYD MACRO 100 MG PO CAPS: 100.0000 mg | ORAL_CAPSULE | Freq: Two times a day (BID) | ORAL | 0 refills | Status: AC

## 2024-04-28 NOTE — ED Triage Notes (Signed)
 Pt present with c/o painful urination and blood when wiping. States she feels vaginal discomfort and itching.

## 2024-04-28 NOTE — ED Provider Notes (Signed)
 [facility]    CSN: [id number] Arrival date & time: 04/28/24  1240      History   Chief Complaint Chief Complaint  Patient presents with   Dysuria    HPI Janet Long is a 23 y.o. female presents for dysuria.  Patient states she woke this morning with burning with urination and blood with wiping.  Denies urinary urgency, frequency, fevers, nausea/vomiting, flank pain.  No vaginal discharge or STD concern.  Denies history of UTIs.  No OTC treatments have been used since onset.  No other concerns at this time.   Dysuria   Past Medical History:  Diagnosis Date   Acne vulgaris    Anxiety     Patient Active Problem List   Diagnosis Date Noted   Acne vulgaris 09/29/2020    No past surgical history on file.  OB History   No obstetric history on file.      Home Medications    Prior to Admission medications   Medication Sig Start Date End Date Taking? Authorizing Provider  nitrofurantoin, macrocrystal-monohydrate, (MACROBID) 100 MG capsule Take 1 capsule (100 mg total) by mouth 2 (two) times daily for 7 days. 04/28/24 05/05/24 Yes Balbina Depace, Jodi R, NP  Adapalene  0.3 % gel APPLY ONE THIN FILM TOPICALLY AT BEDTIME 02/18/24   Artemio Larry, MD  clobetasol  cream (TEMOVATE ) 0.05 % Apply BID to affected areas on leg. Avoid applying to face, groin, and axilla. 02/18/24   Artemio Larry, MD  Dapsone  (ACZONE ) 7.5 % GEL Apply qam to face, wash off qhs Patient not taking: Reported on 02/18/2024 05/24/22   Artemio Larry, MD  drospirenone -ethinyl estradiol  ([name] ) 3-0.02 MG tablet Take 1 tablet by mouth daily. 12/25/22   Artemio Larry, MD  hydrOXYzine  (ATARAX /VISTARIL ) 25 MG tablet Take 0.5 tablets (12.5 mg total) by mouth every 8 (eight) hours as needed for anxiety or itching. Patient not taking: Reported on 02/18/2024 09/29/20   Just, Kelsea J, FNP  pimecrolimus  (ELIDEL ) 1 % cream Apply to rash under arms 1-2 times daily until improved. Patient not taking: Reported on 02/18/2024  07/20/21   Artemio Larry, MD  spironolactone  (ALDACTONE ) 100 MG tablet Take 1 tablet (100 mg total) by mouth daily. Patient not taking: Reported on 02/18/2024 07/20/21   Artemio Larry, MD  tacrolimus  (PROTOPIC ) 0.1 % ointment Apply BID to affected areas on leg 02/18/24   Artemio Larry, MD    Family History Family History  Problem Relation Age of Onset   Cancer Paternal Grandmother        breast    Social History Social History   Tobacco Use   Smoking status: Never   Smokeless tobacco: Never  Substance Use Topics   Alcohol use: No   Drug use: No     Allergies   Patient has no known allergies.   Review of Systems Review of Systems  Genitourinary:  Positive for dysuria.     Physical Exam Triage Vital Signs ED Triage Vitals  Encounter Vitals Group     BP 04/28/24 1259 114/76     Systolic BP Percentile --      Diastolic BP Percentile --      Pulse Rate 04/28/24 1259 87     Resp 04/28/24 1259 18     Temp 04/28/24 1259 99 F (37.2 C)     Temp Source 04/28/24 1259 Oral     SpO2 04/28/24 1259 98 %     Weight --      Height --  Head Circumference --      Peak Flow --      Pain Score 04/28/24 1257 4     Pain Loc --      Pain Education --      Exclude from Growth Chart --    No data found.  Updated Vital Signs BP 114/76 (BP Location: Left Arm)   Pulse 87   Temp 99 F (37.2 C) (Oral)   Resp 18   LMP 03/14/2024 (Exact Date)   SpO2 98%   Visual Acuity Right Eye Distance:   Left Eye Distance:   Bilateral Distance:    Right Eye Near:   Left Eye Near:    Bilateral Near:     Physical Exam Vitals and nursing note reviewed.  Constitutional:      Appearance: Normal appearance.  HENT:     Head: Normocephalic and atraumatic.  Eyes:     Pupils: Pupils are equal, round, and reactive to light.  Cardiovascular:     Rate and Rhythm: Normal rate.  Pulmonary:     Effort: Pulmonary effort is normal.  Abdominal:     Tenderness: There is no right CVA tenderness  or left CVA tenderness.  Skin:    General: Skin is warm and dry.  Neurological:     General: No focal deficit present.     Mental Status: She is alert and oriented to person, place, and time.  Psychiatric:        Mood and Affect: Mood normal.        Behavior: Behavior normal.      UC Treatments / Results  Labs (all labs ordered are listed, but only abnormal results are displayed) Labs Reviewed  POCT URINALYSIS DIP (MANUAL ENTRY) - Abnormal; Notable for the following components:      Result Value   Clarity, UA cloudy (*)    Blood, UA large (*)    Protein Ur, POC >=300 (*)    Leukocytes, UA Small (1+) (*)    All other components within normal limits  URINE CULTURE  POCT URINE PREGNANCY    EKG   Radiology No results found.  Procedures Procedures (including critical care time)  Medications Ordered in UC Medications - No data to display  Initial Impression / Assessment and Plan / UC Course  I have reviewed the triage vital signs and the nursing notes.  Pertinent labs & imaging results that were available during my care of the patient were reviewed by me and considered in my medical decision making (see chart for details).     Reviewed exam and symptoms with patient.  No red flags.  UA positive for UTI, will send urine culture and contact for any positive results.  Start Macrobid.  Encouraged rest, fluids, PCP follow-up if symptoms do not improve.  ER precautions reviewed. Final Clinical Impressions(s) / UC Diagnoses   Final diagnoses:  Acute cystitis with hematuria     Discharge Instructions      The clinic will contact you with results of the urine culture done today if positive.  Start Macrobid twice daily for 7 days.  Lots of rest and fluids.  Please follow-up with your PCP if your symptoms do not improve.  Please go to the ER for any worsening symptoms.  Hope you feel better soon!   ED Prescriptions     Medication Sig Dispense Auth. Provider    nitrofurantoin, macrocrystal-monohydrate, (MACROBID) 100 MG capsule Take 1 capsule (100 mg total) by mouth 2 (two) times  daily for 7 days. 14 capsule Fuad Forget, Jodi R, NP      PDMP not reviewed this encounter.   Alleen Arbour, NP 04/28/24 1324

## 2024-04-28 NOTE — Discharge Instructions (Addendum)
 The clinic will contact you with results of the urine culture done today if positive.  Start Macrobid twice daily for 7 days.  Lots of rest and fluids.  Please follow-up with your PCP if your symptoms do not improve.  Please go to the ER for any worsening symptoms.  Hope you feel better soon!

## 2024-04-29 LAB — URINE CULTURE: Culture: <10000 — AB

## 2024-04-30 ENCOUNTER — Ambulatory Visit (HOSPITAL_COMMUNITY): Payer: Self-pay

## 2024-06-20 ENCOUNTER — Encounter: Payer: Self-pay | Admitting: Advanced Practice Midwife
